# Patient Record
Sex: Female | Born: 1970 | Race: White | Hispanic: No | Marital: Single | Smoking: Former smoker
Health system: Southern US, Community
[De-identification: ages and names within clinical notes are randomized; demographics above are authoritative.]

## PROBLEM LIST (undated history)

## (undated) DIAGNOSIS — E079 Disorder of thyroid, unspecified: Secondary | ICD-10-CM

## (undated) DIAGNOSIS — N6320 Unspecified lump in the left breast, unspecified quadrant: Secondary | ICD-10-CM

## (undated) HISTORY — PX: BREAST BIOPSY: SHX20

## (undated) HISTORY — PX: TUBAL LIGATION: SHX77

## (undated) HISTORY — PX: THYROIDECTOMY, PARTIAL: SHX18

## (undated) HISTORY — PX: OTHER SURGICAL HISTORY: SHX169

## (undated) HISTORY — PX: WISDOM TOOTH EXTRACTION: SHX21

---

## 1999-06-24 ENCOUNTER — Inpatient Hospital Stay (HOSPITAL_COMMUNITY): Admission: AD | Admit: 1999-06-24 | Discharge: 1999-06-24 | Payer: Self-pay | Admitting: Obstetrics and Gynecology

## 1999-10-04 ENCOUNTER — Other Ambulatory Visit: Admission: RE | Admit: 1999-10-04 | Discharge: 1999-10-04 | Payer: Self-pay | Admitting: Obstetrics and Gynecology

## 1999-10-26 ENCOUNTER — Other Ambulatory Visit: Admission: RE | Admit: 1999-10-26 | Discharge: 1999-10-26 | Payer: Self-pay | Admitting: Obstetrics and Gynecology

## 2000-02-17 ENCOUNTER — Inpatient Hospital Stay (HOSPITAL_COMMUNITY): Admission: AD | Admit: 2000-02-17 | Discharge: 2000-02-17 | Payer: Self-pay | Admitting: *Deleted

## 2000-03-06 ENCOUNTER — Ambulatory Visit (HOSPITAL_COMMUNITY): Admission: RE | Admit: 2000-03-06 | Discharge: 2000-03-06 | Payer: Self-pay | Admitting: *Deleted

## 2000-03-06 ENCOUNTER — Encounter: Payer: Self-pay | Admitting: *Deleted

## 2000-04-19 ENCOUNTER — Inpatient Hospital Stay (HOSPITAL_COMMUNITY): Admission: AD | Admit: 2000-04-19 | Discharge: 2000-04-23 | Payer: Self-pay | Admitting: Obstetrics and Gynecology

## 2000-05-23 ENCOUNTER — Other Ambulatory Visit: Admission: RE | Admit: 2000-05-23 | Discharge: 2000-05-23 | Payer: Self-pay | Admitting: Obstetrics and Gynecology

## 2000-08-23 ENCOUNTER — Other Ambulatory Visit: Admission: RE | Admit: 2000-08-23 | Discharge: 2000-08-23 | Payer: Self-pay | Admitting: Obstetrics and Gynecology

## 2001-03-23 ENCOUNTER — Other Ambulatory Visit: Admission: RE | Admit: 2001-03-23 | Discharge: 2001-03-23 | Payer: Self-pay | Admitting: Obstetrics and Gynecology

## 2001-09-12 ENCOUNTER — Encounter: Payer: Self-pay | Admitting: Obstetrics and Gynecology

## 2001-09-12 ENCOUNTER — Inpatient Hospital Stay (HOSPITAL_COMMUNITY): Admission: AD | Admit: 2001-09-12 | Discharge: 2001-09-12 | Payer: Self-pay | Admitting: Obstetrics and Gynecology

## 2001-11-13 ENCOUNTER — Inpatient Hospital Stay (HOSPITAL_COMMUNITY): Admission: AD | Admit: 2001-11-13 | Discharge: 2001-11-13 | Payer: Self-pay | Admitting: Obstetrics and Gynecology

## 2001-11-16 ENCOUNTER — Inpatient Hospital Stay (HOSPITAL_COMMUNITY): Admission: AD | Admit: 2001-11-16 | Discharge: 2001-11-19 | Payer: Self-pay | Admitting: Obstetrics and Gynecology

## 2002-01-02 ENCOUNTER — Other Ambulatory Visit: Admission: RE | Admit: 2002-01-02 | Discharge: 2002-01-02 | Payer: Self-pay | Admitting: Obstetrics and Gynecology

## 2002-04-04 ENCOUNTER — Other Ambulatory Visit: Admission: RE | Admit: 2002-04-04 | Discharge: 2002-04-04 | Payer: Self-pay | Admitting: Obstetrics and Gynecology

## 2003-07-11 ENCOUNTER — Other Ambulatory Visit: Admission: RE | Admit: 2003-07-11 | Discharge: 2003-07-11 | Payer: Self-pay | Admitting: Obstetrics and Gynecology

## 2005-03-03 ENCOUNTER — Other Ambulatory Visit: Admission: RE | Admit: 2005-03-03 | Discharge: 2005-03-03 | Payer: Self-pay | Admitting: Obstetrics and Gynecology

## 2005-04-22 ENCOUNTER — Ambulatory Visit (HOSPITAL_COMMUNITY): Admission: RE | Admit: 2005-04-22 | Discharge: 2005-04-22 | Payer: Self-pay | Admitting: Obstetrics and Gynecology

## 2005-04-27 ENCOUNTER — Other Ambulatory Visit: Admission: RE | Admit: 2005-04-27 | Discharge: 2005-04-27 | Payer: Self-pay | Admitting: Surgery

## 2005-06-14 ENCOUNTER — Ambulatory Visit (HOSPITAL_COMMUNITY): Admission: RE | Admit: 2005-06-14 | Discharge: 2005-06-15 | Payer: Self-pay | Admitting: Surgery

## 2011-01-10 ENCOUNTER — Inpatient Hospital Stay (INDEPENDENT_AMBULATORY_CARE_PROVIDER_SITE_OTHER)
Admission: RE | Admit: 2011-01-10 | Discharge: 2011-01-10 | Disposition: A | Discharge status: Home / Self Care | Payer: 59 | Source: Ambulatory Visit | Attending: Emergency Medicine | Admitting: Emergency Medicine

## 2011-01-10 ENCOUNTER — Ambulatory Visit
Admission: RE | Admit: 2011-01-10 | Discharge: 2011-01-10 | Disposition: A | Discharge status: Home / Self Care | Payer: 59 | Source: Ambulatory Visit | Attending: Emergency Medicine | Admitting: Emergency Medicine

## 2011-01-10 ENCOUNTER — Other Ambulatory Visit: Payer: Self-pay | Admitting: Emergency Medicine

## 2011-01-10 ENCOUNTER — Encounter (INDEPENDENT_AMBULATORY_CARE_PROVIDER_SITE_OTHER): Payer: Self-pay | Admitting: *Deleted

## 2011-01-10 ENCOUNTER — Encounter: Payer: Self-pay | Admitting: Emergency Medicine

## 2011-01-10 DIAGNOSIS — R079 Chest pain, unspecified: Secondary | ICD-10-CM

## 2011-01-11 ENCOUNTER — Telehealth (INDEPENDENT_AMBULATORY_CARE_PROVIDER_SITE_OTHER): Payer: Self-pay | Admitting: *Deleted

## 2011-05-09 NOTE — Telephone Encounter (Signed)
  Phone Note Outgoing Call Call back at Home Phone [phone] [facility]     Call placed by: Lajean Saver RN,  January 11, 2011 4:06 PM Action Taken: Phone Call Completed Summary of Call: Callback: Patient reports she is doing better today.

## 2011-05-09 NOTE — Letter (Signed)
Summary: Out of Work  [facility]  [address]   [place], [place] [address]   Phone: [phone]  Fax: [phone]    January 10, 2011   Employee:  [name]    To Whom It May Concern:   For Medical reasons, please excuse the above named employee from work for the following dates:  Start:   01/10/2011  Return: 01/11/2011  If you need additional information, please feel free to contact our office.         Sincerely,    Clemens Catholic LPN

## 2011-05-09 NOTE — Progress Notes (Signed)
Summary: chest pain   Vital Signs:  Patient Profile:   40 Years Old Female CC:      chest pain x 1day Weight:      158.50 pounds O2 Sat:      99 % O2 treatment:    Room Air Temp:     98.3 degrees F oral Pulse rate:   81 / minute Resp:     20 per minute BP sitting:   119 / 81  (left arm) Cuff size:   regular  Vitals Entered By: Clemens Catholic LPN (January 10, 2011 9:42 AM)                  Updated Prior Medication List: No Medications Current Allergies: ! * BEE STINGSHistory of Present Illness Chief Complaint: chest pain x 1day History of Present Illness: CP since yesterday.  Was driving [place] from the beach and developed a sharp CP, right-central anterior chest, mild SOB, no diaphoresis, no radiation.  No HA, dizziness, blurry vision.  The pain is fairly constant although comes in waves with deep breaths and coughing.  She has had sinus drainage for the last few days as well. She reports some stress with work and while taking care of the kids at [employer].   RF: father alive, mother passed in her 14's, former smoker, no HTN, no dyslipidemia.  REVIEW OF SYSTEMS Constitutional Symptoms      Denies fever, chills, night sweats, weight loss, weight gain, and fatigue.  Eyes       Denies change in vision, eye pain, eye discharge, glasses, contact lenses, and eye surgery. Ear/Nose/Throat/Mouth       Denies hearing loss/aids, change in hearing, ear pain, ear discharge, dizziness, frequent runny nose, frequent nose bleeds, sinus problems, sore throat, hoarseness, and tooth pain or bleeding.  Respiratory       Denies dry cough, productive cough, wheezing, shortness of breath, asthma, bronchitis, and emphysema/COPD.  Cardiovascular       Complains of chest pain.      Denies murmurs and tires easily with exhertion.    Gastrointestinal       Denies stomach pain, nausea/vomiting, diarrhea, constipation, blood in bowel movements, and indigestion. Genitourniary       Denies painful  urination, kidney stones, and loss of urinary control. Neurological       Denies paralysis, seizures, and fainting/blackouts. Musculoskeletal       Denies muscle pain, joint pain, joint stiffness, decreased range of motion, redness, swelling, muscle weakness, and gout.  Skin       Denies bruising, unusual mles/lumps or sores, and hair/skin or nail changes.  Psych       Denies mood changes, temper/anger issues, anxiety/stress, speech problems, depression, and sleep problems. Other Comments: the pt states that she has had sinus problems x 1 1/2 wk. she has taken dayquil and echinacea. yesterday she developed chest pain, hurts when she breaths, and nausea. no arm, neck or jaw pain.   Past History:  Past Medical History: Unremarkable  Past Surgical History: Caesarean section Tubal ligation Thyroidectomy wisdom teeth extracted  ablasion  Family History: mom Family History of Arthritis Family History Thyroid disease auto immune liver disorder  Social History:  Smoker- quit a few mths ago Alcohol use-yes 2 glasses per wk Drug use-no Smoking Status:  current Drug Use:  no Physical Exam General appearance: well developed, well nourished, tearful but improves during exam Ears: normal, no lesions or deformities Nasal: clear discharge Oral/Pharynx: tongue normal,  posterior pharynx without erythema or exudate Neck: no carotid bruits Chest/Lungs: no rales, wheezes, or rhonchi bilateral, breath sounds equal without effort Heart: regular rate and  rhythm, no murmur Extremities: normal extremities Neurological: CN2-12 intact, normal strength and sensation distally Skin: no obvious rashes or lesions MSE: oriented to time, place, and person Assessment New Problems: CHEST PAIN, ACUTE (ICD-786.50)   Plan New Orders: New Patient Level IV [99204] EKG w/ Interpretation [93000] T-Chest x-ray, 2 views [71020] Pulse Oximetry (single measurment) [94760] Planning Comments:   EKG is  normal.  CXR is normal.  Normal VS and no red flags.  I don't feel labs are appropriate today and don't feel this is cardiac.  Her symptoms are c/w costochondritis vs chest wall strain.  Advise rest, gentle stretching (info sheet given), ATC Ibu for 2 days.  ER precautions given for further CP, SOB, etc.  Pt agrees and understands.   The patient and/or caregiver has been counseled thoroughly with regard to medications prescribed including dosage, schedule, interactions, rationale for use, and possible side effects and they verbalize understanding.  Diagnoses and expected course of recovery discussed and will return if not improved as expected or if the condition worsens. Patient and/or caregiver verbalized understanding.   Orders Added: 1)  New Patient Level IV [99204] 2)  EKG w/ Interpretation [93000] 3)  T-Chest x-ray, 2 views [71020] 4)  Pulse Oximetry (single measurment) [16109]

## 2012-06-28 ENCOUNTER — Encounter: Payer: Self-pay | Admitting: *Deleted

## 2012-06-28 ENCOUNTER — Emergency Department (INDEPENDENT_AMBULATORY_CARE_PROVIDER_SITE_OTHER)
Admission: EM | Admit: 2012-06-28 | Discharge: 2012-06-28 | Disposition: A | Discharge status: Home / Self Care | Payer: 59 | Source: Home / Self Care | Attending: Family Medicine | Admitting: Family Medicine

## 2012-06-28 DIAGNOSIS — J111 Influenza due to unidentified influenza virus with other respiratory manifestations: Secondary | ICD-10-CM

## 2012-06-28 MED ORDER — BENZONATATE 200 MG PO CAPS: 200.0000 mg | ORAL_CAPSULE | Freq: Every day | ORAL | Status: DC

## 2012-06-28 MED ORDER — OSELTAMIVIR PHOSPHATE 75 MG PO CAPS: 75.0000 mg | ORAL_CAPSULE | Freq: Two times a day (BID) | ORAL | Status: DC

## 2012-06-28 NOTE — ED Notes (Signed)
Patient c/o dry cough, low grade fever, chills, body aches x 3 days. She did not get a flu vaccine this year. Taken Day/nyquil, IBF and Mucinex otc.

## 2012-06-28 NOTE — ED Provider Notes (Signed)
History     CSN: [id number]  Arrival date & time 06/28/12  4098   First MD Initiated Contact with Patient 06/28/12 1019      Chief Complaint  Patient presents with  . Generalized Body Aches  . Cough      HPI Comments: Patient complains of dry cough, low grade fever, chills, body aches, and fatigue for 3 days. She did not get a flu vaccine this year.  The history is provided by the patient.    History reviewed. No pertinent past medical history.  Past Surgical History  Procedure Date  . Thyroidectomy, partial   . Cesarean section   . Wisdom tooth extraction     History reviewed. No pertinent family history.  History  Substance Use Topics  . Smoking status: Never Smoker   . Smokeless tobacco: Not on file  . Alcohol Use: Yes    OB History    Grav Para Term Preterm Abortions TAB SAB Ect Mult Living                  Review of Systems + sore throat + cough No pleuritic pain but has tightness in anterior chest No wheezing + nasal congestion + post-nasal drainage No sinus pain/pressure No itchy/red eyes No earache No hemoptysis No SOB No fever, + chills No nausea No vomiting No abdominal pain No diarrhea No urinary symptoms No skin rashes + fatigue + myalgias + headache Used OTC meds without relief  Allergies  Bee venom  Home Medications   Current Outpatient Rx  Name  Route  Sig  Dispense  Refill  . BENZONATATE 200 MG PO CAPS   Oral   Take 1 capsule (200 mg total) by mouth at bedtime. Take as needed for cough   12 capsule   0   . OSELTAMIVIR PHOSPHATE 75 MG PO CAPS   Oral   Take 1 capsule (75 mg total) by mouth every 12 (twelve) hours.   10 capsule   0     BP 111/74  Pulse 103  Temp 99.2 F (37.3 C) (Oral)  Resp 16  Ht 5\' 10"  (1.778 m)  Wt 176 lb (79.833 kg)  BMI 25.25 kg/m2  SpO2 100%  Physical Exam Nursing notes and Vital Signs reviewed. Appearance:  Patient appears healthy, stated age, and in no acute distress Eyes:   Pupils are equal, round, and reactive to light and accomodation.  Extraocular movement is intact.  Conjunctivae are not inflamed  Ears:  Canals normal.  Tympanic membranes normal.  Nose:  Mildly congested turbinates.  No sinus tenderness.   Pharynx:  Normal Neck:  Supple.   Tender shotty posterior nodes are palpated bilaterally  Lungs:  Clear to auscultation.  Breath sounds are equal.  Chest:   Mild tenderness to palpation over the mid-sternum.  Heart:  Regular rate and rhythm without murmurs, rubs, or gallops.  Abdomen:  Nontender without masses or hepatosplenomegaly.  Bowel sounds are present.  No CVA or flank tenderness.  Extremities:  No edema.  No calf tenderness Skin:  No rash present.   ED Course  Procedures none      1. Influenza-like illness       MDM  Begin Tamiflu.  Prescription written for Benzonatate [facility]) to take at bedtime for night-time cough.  Take Mucinex D (guaifenesin with decongestant) twice daily for congestion.  Increase fluid intake, rest. May use Afrin nasal spray (or generic oxymetazoline) twice daily for about 5 days.  Also recommend using  saline nasal spray several times daily and saline nasal irrigation (AYR is a common brand) Stop all antihistamines for now, and other non-prescription cough/cold preparations. May take Ibuprofen 200mg , 4 tabs every 8 hours with food for body aches, fever, etc. Followup with Family Doctor if not improved in one week.          Lattie Haw, MD 06/28/12 1143

## 2012-08-24 ENCOUNTER — Emergency Department (INDEPENDENT_AMBULATORY_CARE_PROVIDER_SITE_OTHER)
Admission: EM | Admit: 2012-08-24 | Discharge: 2012-08-24 | Disposition: A | Discharge status: Home / Self Care | Payer: 59 | Source: Home / Self Care | Attending: Family Medicine | Admitting: Family Medicine

## 2012-08-24 ENCOUNTER — Encounter: Payer: Self-pay | Admitting: Emergency Medicine

## 2012-08-24 DIAGNOSIS — R197 Diarrhea, unspecified: Secondary | ICD-10-CM

## 2012-08-24 DIAGNOSIS — R300 Dysuria: Secondary | ICD-10-CM

## 2012-08-24 HISTORY — DX: Disorder of thyroid, unspecified: E07.9

## 2012-08-24 LAB — POCT URINALYSIS DIP (MANUAL ENTRY)
Leukocytes, UA: NEGATIVE
Nitrite, UA: NEGATIVE
Protein Ur, POC: NEGATIVE
Urobilinogen, UA: 0.2 (ref 0–1)
pH, UA: 6 (ref 5–8)

## 2012-08-24 LAB — POCT CBC W AUTO DIFF (K'VILLE URGENT CARE)

## 2012-08-24 LAB — POCT RAPID STREP A (OFFICE): Rapid Strep A Screen: NEGATIVE

## 2012-08-24 NOTE — ED Provider Notes (Signed)
History     CSN: [id number]  Arrival date & time 08/24/12  1826   First MD Initiated Contact with Patient 08/24/12 1851      Chief Complaint  Patient presents with  . Sore Throat       HPI Comments: Patient complains of onset of nausea without vomiting today, and about mid-day developed diarrhea.  She has had a mild sore throat, chills, fatigue, and myalgias.  No nasal congestion or cough.  She complains of malodorous urine for about two months but no dysuria or frequency.  Patient is a 42 y.o. female presenting with pharyngitis and diarrhea.  Sore Throat Pertinent negatives include no abdominal pain and no headaches.  Diarrhea Quality:  Watery Severity:  Moderate Onset quality:  Sudden Duration:  6 hours Timing:  Intermittent Progression:  Improving Relieved by:  Nothing Worsened by:  Nothing tried Associated symptoms: chills and myalgias   Associated symptoms: no abdominal pain, no arthralgias, no recent cough, no diaphoresis, no fever, no headaches, no URI and no vomiting     Past Medical History  Diagnosis Date  . Thyroid disease     Past Surgical History  Procedure Laterality Date  . Thyroidectomy, partial    . Cesarean section    . Wisdom tooth extraction    . Uterine ablation    . Cesarean section with bilateral tubal ligation      Family History  Problem Relation Age of Onset  . Diabetes Father   . Heart failure Father     History  Substance Use Topics  . Smoking status: Never Smoker   . Smokeless tobacco: Not on file  . Alcohol Use: Yes    OB History   Grav Para Term Preterm Abortions TAB SAB Ect Mult Living                  Review of Systems  Constitutional: Positive for chills. Negative for fever and diaphoresis.  Gastrointestinal: Positive for diarrhea. Negative for vomiting and abdominal pain.  Musculoskeletal: Positive for myalgias. Negative for arthralgias.  Neurological: Negative for headaches.  All other systems reviewed and are  negative.    Allergies  Bee venom  Home Medications   Current Outpatient Rx  Name  Route  Sig  Dispense  Refill  . benzonatate (TESSALON) 200 MG capsule   Oral   Take 1 capsule (200 mg total) by mouth at bedtime. Take as needed for cough   12 capsule   0   . oseltamivir (TAMIFLU) 75 MG capsule   Oral   Take 1 capsule (75 mg total) by mouth every 12 (twelve) hours.   10 capsule   0     BP 137/80  Pulse 74  Temp(Src) 97.9 F (36.6 C) (Oral)  Ht 5\' 10"  (1.778 m)  Wt 177 lb (80.287 kg)  BMI 25.4 kg/m2  SpO2 100%  Physical Exam Nursing notes and Vital Signs reviewed. Appearance:  Patient appears healthy, stated age, and in no acute distress Eyes:  Pupils are equal, round, and reactive to light and accomodation.  Extraocular movement is intact.  Conjunctivae are not inflamed  Nose:   Normal Pharynx:  Minimal erythema Neck:  Supple.  Slightly tender shotty anterior/posterior nodes are palpated bilaterally  Lungs:  Clear to auscultation.  Breath sounds are equal.  Heart:  Regular rate and rhythm without murmurs, rubs, or gallops.  Abdomen:   Mild diffuse tenderness without masses or hepatosplenomegaly.  Bowel sounds are present.  No CVA or  flank tenderness.  Negative iliopsoas and obdurator tests Extremities:  No edema.   Skin:  No rash present.   ED Course  Procedures  none  Labs Reviewed  URINE CULTURE pending  COMPREHENSIVE METABOLIC PANEL pending  POCT RAPID STREP A (OFFICE) negative  POCT URINALYSIS DIP (MANUAL ENTRY) BLO trace intact, otherwise negative   POCT CBC W AUTO DIFF ([facility]) WBC 7.2; LY 51.0; MO 4.5; GR 44.5; Hgb 13.9; Platelets 217       1. Dysuria   2. Diarrhea; suspect viral syndrome       MDM  Because of patient's complaint of malodorous urine (without dysuria, frequency) and weight gain for about 2 months, will obtain urine culture and CMP. Throat culture pending  Begin clear liquids (Pedialyte while having diarrhea) until  improved, then advance to a BRAT diet.  Then gradually resume a regular diet when tolerated.  Avoid milk products until well.  To decrease diarrhea, mix one heaping tablespoon Citrucel (methylcellulose) in 8 oz water and drink one to three times daily.  When stools become more formed, may take Imodium (loperamide) once or twice daily to decrease stool frequency.  If symptoms become significantly worse during the night or over the weekend, proceed to the local emergency room.        Lattie Haw, MD 08/24/12 2004

## 2012-08-24 NOTE — ED Notes (Signed)
Reports onset of sore throat with sense of internal swelling yesterday; has other concerns over past months.

## 2012-08-25 LAB — COMPREHENSIVE METABOLIC PANEL
ALT: 18 U/L (ref 0–35)
AST: 16 U/L (ref 0–37)
Albumin: 4.7 g/dL (ref 3.5–5.2)
Alkaline Phosphatase: 69 U/L (ref 39–117)
Calcium: 9.4 mg/dL (ref 8.4–10.5)
Chloride: 103 mEq/L (ref 96–112)
Creat: 0.67 mg/dL (ref 0.50–1.10)
Potassium: 4.6 mEq/L (ref 3.5–5.3)

## 2012-08-28 LAB — URINE CULTURE: Colony Count: 45000

## 2012-08-29 ENCOUNTER — Telehealth: Payer: Self-pay | Admitting: *Deleted

## 2014-01-01 ENCOUNTER — Encounter: Payer: Self-pay | Admitting: Emergency Medicine

## 2014-01-01 ENCOUNTER — Emergency Department (INDEPENDENT_AMBULATORY_CARE_PROVIDER_SITE_OTHER)
Admission: EM | Admit: 2014-01-01 | Discharge: 2014-01-01 | Disposition: A | Discharge status: Home / Self Care | Payer: 59 | Source: Home / Self Care | Attending: Family Medicine | Admitting: Family Medicine

## 2014-01-01 DIAGNOSIS — W57XXXA Bitten or stung by nonvenomous insect and other nonvenomous arthropods, initial encounter: Principal | ICD-10-CM

## 2014-01-01 DIAGNOSIS — S30860A Insect bite (nonvenomous) of lower back and pelvis, initial encounter: Secondary | ICD-10-CM

## 2014-01-01 MED ORDER — DOXYCYCLINE HYCLATE 100 MG PO CAPS: 100.0000 mg | ORAL_CAPSULE | Freq: Two times a day (BID) | ORAL | Status: DC

## 2014-01-01 NOTE — ED Notes (Signed)
Pt c/o tick bite on her LT buttock x 1 wk ago with rash.

## 2014-01-01 NOTE — ED Provider Notes (Signed)
CSN: [id number]     Arrival date & time 01/01/14  1843 History   First MD Initiated Contact with Patient 01/01/14 1938     Chief Complaint  Patient presents with  . Insect Bite  . Rash      HPI Comments: Patient removed a tick (about 64mm diameter) from her left buttock about 1.5 weeks ago.  The tick was embedded but not engorged.  The bite site became pruritic, and the erythema has gradually increased in diameter.  She feels well and has had no systemic symptoms.  The history is provided by the patient.    Past Medical History  Diagnosis Date  . Thyroid disease    Past Surgical History  Procedure Laterality Date  . Thyroidectomy, partial    . Cesarean section    . Wisdom tooth extraction    . Uterine ablation    . Cesarean section with bilateral tubal ligation     Family History  Problem Relation Age of Onset  . Diabetes Father   . Heart failure Father    History  Substance Use Topics  . Smoking status: Never Smoker   . Smokeless tobacco: Not on file  . Alcohol Use: Yes   OB History   Grav Para Term Preterm Abortions TAB SAB Ect Mult Living                 Review of Systems  Constitutional: Negative for fever, chills, activity change and fatigue.  HENT: Negative.   Eyes: Negative.   Respiratory: Negative.   Cardiovascular: Negative.   Gastrointestinal: Negative.   Genitourinary: Negative.   Musculoskeletal: Negative for arthralgias, joint swelling and myalgias.  Skin: Positive for rash.  Neurological: Negative for headaches.    Allergies  Bee venom  Home Medications   Prior to Admission medications   Medication Sig Start Date End Date Taking? Authorizing Provider  phentermine 37.5 MG capsule Take 37.5 mg by mouth every morning.   Yes Historical Provider, MD  doxycycline (VIBRAMYCIN) 100 MG capsule Take 1 capsule (100 mg total) by mouth 2 (two) times daily. Take with food. 01/01/14   Kandra Nicolas, MD   BP 117/74  Pulse 77  Temp(Src) 98.9 F (37.2 C)  (Oral)  Resp 16  Ht 5\' 10"  (1.778 m)  Wt 172 lb (78.019 kg)  BMI 24.68 kg/m2  SpO2 100% Physical Exam  Nursing note and vitals reviewed. Constitutional: She is oriented to person, place, and time. She appears well-developed and well-nourished. No distress.  HENT:  Head: Normocephalic.  Mouth/Throat: Oropharynx is clear and moist.  Eyes: Conjunctivae are normal. Pupils are equal, round, and reactive to light.  Neck: Neck supple.  Cardiovascular: Normal heart sounds.   Pulmonary/Chest: Breath sounds normal.  Abdominal: There is no tenderness.  Lymphadenopathy:    She has no cervical adenopathy.  Neurological: She is alert and oriented to person, place, and time.  Skin: Skin is warm and dry.     Left upper buttock has a 3cm diameter nummular patch of erythema as noted on diagram.  No tenderness or swelling.      ED Course  Procedures  none    Labs Reviewed  ROCKY MTN SPOTTED FVR AB, IGM-BLOOD  ROCKY MTN SPOTTED FVR AB, IGG-BLOOD  LYME DISEASE DNA BY PCR(BORRELIA BURG)         MDM   1. Tick bite of buttock, initial encounter; suspect superficial cellulitis      Begin doxycycline for staph coverage. RMSF and Lyme  Disease titers pending. Return for worsening symptoms.    Kandra Nicolas, MD 01/05/14 [phone]

## 2014-01-01 NOTE — Discharge Instructions (Signed)
Lyme Disease You may have been bitten by a tick and are to watch for the development of Lyme Disease. Lyme Disease is an infection that is caused by a bacteria The bacteria causing this disease is named Borreilia burgdorferi. If a tick is infected with this bacteria and then bites you, then Lyme Disease may occur. These ticks are carried by deer and rodents such as rabbits and mice and infest grassy as well as forested areas. Fortunately most tick bites do not cause Lyme Disease.  Lyme Disease is easier to prevent than to treat. First, covering your legs with clothing when walking in areas where ticks are possibly abundant will prevent their attachment because ticks tend to stay within inches of the ground. Second, using insecticides containing DEET can be applied on skin or clothing. Last, because it takes about 12 to 24 hours for the tick to transmit the disease after attachment to the human host, you should inspect your body for ticks twice a day when you are in areas where Lyme Disease is common. You must look thoroughly when searching for ticks. The Ixodes tick that carries Lyme Disease is very small. It is around the size of a sesame seed (picture of tick is not actual size). Removal is best done by grasping the tick by the head and pulling it out. Do not to squeeze the body of the tick. This could inject the infecting bacteria into the bite site. Wash the area of the bite with an antiseptic solution after removal.  Lyme Disease is a disease that may affect many body systems. Because of the small size of the biting tick, most people do not notice being bitten. The first sign of an infection is usually a round red rash that extends out from the center of the tick bite. The center of the lesion may be blood colored (hemorrhagic) or have tiny blisters (vesicular). Most lesions have bright red outer borders and partial central clearing. This rash may extend out many inches in diameter, and multiple lesions may  be present. Other symptoms such as fatigue, headaches, chills and fever, general achiness and swelling of lymph glands may also occur. If this first stage of the disease is left untreated, these symptoms may gradually resolve by themselves, or progressive symptoms may occur because of spread of infection to other areas of the body.  Follow up with your caregiver to have testing and treatment if you have a tick bite and you develop any of the above complaints. Your caregiver may recommend preventative (prophylactic) medications which kill bacteria (antibiotics). Once a diagnosis of Lyme Disease is made, antibiotic treatment is highly likely to cure the disease. Effective treatment of late stage Lyme Disease may require longer courses of antibiotic therapy.  MAKE SURE YOU:   Understand these instructions.  Will watch your condition.  Will get help right away if you are not doing well or get worse. Document Released: 08/29/2000 Document Revised: 08/15/2011 Document Reviewed: 10/31/2008 Exit[employer] Patient Information 2015 [employer]. This information is not intended to replace advice given to you by your health care provider. Make sure you discuss any questions you have with your health care provider.  

## 2014-01-02 LAB — ROCKY MTN SPOTTED FVR AB, IGM-BLOOD: ROCKY MTN SPOTTED FEVER, IGM: 0.16 IV

## 2014-01-02 LAB — ROCKY MTN SPOTTED FVR AB, IGG-BLOOD: RMSF IgG: 0.09 IV

## 2014-01-03 LAB — LYME DISEASE DNA BY PCR(BORRELIA BURG): B BURGDORFERI DNA: NOT DETECTED

## 2014-01-07 ENCOUNTER — Telehealth: Payer: Self-pay

## 2014-01-07 NOTE — ED Notes (Addendum)
Left a message on voice mail asking how patient is feeling and advising to call back with any questions or concerns. Also left message that her labs where normal.

## 2014-02-06 ENCOUNTER — Ambulatory Visit
Admission: RE | Admit: 2014-02-06 | Discharge: 2014-02-06 | Disposition: A | Discharge status: Home / Self Care | Payer: 59 | Source: Ambulatory Visit | Attending: Obstetrics and Gynecology | Admitting: Obstetrics and Gynecology

## 2014-02-06 ENCOUNTER — Ambulatory Visit
Admission: RE | Admit: 2014-02-06 | Discharge: 2014-02-06 | Disposition: A | Discharge status: Home / Self Care | Payer: 59 | Source: Ambulatory Visit

## 2014-02-06 ENCOUNTER — Other Ambulatory Visit: Payer: Self-pay | Admitting: Obstetrics and Gynecology

## 2014-02-06 ENCOUNTER — Encounter (INDEPENDENT_AMBULATORY_CARE_PROVIDER_SITE_OTHER): Payer: Self-pay

## 2014-02-06 DIAGNOSIS — N63 Unspecified lump in unspecified breast: Secondary | ICD-10-CM

## 2014-02-14 ENCOUNTER — Ambulatory Visit
Admission: RE | Admit: 2014-02-14 | Discharge: 2014-02-14 | Disposition: A | Discharge status: Home / Self Care | Payer: 59 | Source: Ambulatory Visit | Attending: Obstetrics and Gynecology | Admitting: Obstetrics and Gynecology

## 2014-02-14 ENCOUNTER — Other Ambulatory Visit: Payer: Self-pay | Admitting: Obstetrics and Gynecology

## 2014-02-14 DIAGNOSIS — N63 Unspecified lump in unspecified breast: Secondary | ICD-10-CM

## 2014-03-05 ENCOUNTER — Other Ambulatory Visit: Payer: Self-pay | Admitting: Obstetrics and Gynecology

## 2014-03-06 LAB — CYTOLOGY - PAP

## 2015-06-05 ENCOUNTER — Encounter: Payer: Self-pay | Admitting: *Deleted

## 2015-06-05 ENCOUNTER — Emergency Department (INDEPENDENT_AMBULATORY_CARE_PROVIDER_SITE_OTHER)
Admission: EM | Admit: 2015-06-05 | Discharge: 2015-06-05 | Disposition: A | Discharge status: Home / Self Care | Payer: 59 | Source: Home / Self Care | Attending: Family Medicine | Admitting: Family Medicine

## 2015-06-05 DIAGNOSIS — M542 Cervicalgia: Secondary | ICD-10-CM | POA: Diagnosis not present

## 2015-06-05 DIAGNOSIS — M6248 Contracture of muscle, other site: Secondary | ICD-10-CM

## 2015-06-05 DIAGNOSIS — M62838 Other muscle spasm: Secondary | ICD-10-CM

## 2015-06-05 MED ORDER — IBUPROFEN 600 MG PO TABS
600.0000 mg | ORAL_TABLET | Freq: Once | ORAL | Status: AC
  Administered 2015-06-05: 600 mg via ORAL

## 2015-06-05 MED ORDER — DEXAMETHASONE SODIUM PHOSPHATE 10 MG/ML IJ SOLN
10.0000 mg | Freq: Once | INTRAMUSCULAR | Status: AC
  Administered 2015-06-05: 10 mg via INTRAMUSCULAR

## 2015-06-05 MED ORDER — HYDROCODONE-ACETAMINOPHEN 5-325 MG PO TABS: 1.0000 | ORAL_TABLET | Freq: Four times a day (QID) | ORAL | Status: DC | PRN

## 2015-06-05 MED ORDER — METHOCARBAMOL 500 MG PO TABS: 500.0000 mg | ORAL_TABLET | Freq: Two times a day (BID) | ORAL | Status: DC

## 2015-06-05 MED ORDER — IBUPROFEN 600 MG PO TABS: 600.0000 mg | ORAL_TABLET | Freq: Four times a day (QID) | ORAL | Status: DC | PRN

## 2015-06-05 MED ORDER — PREDNISONE 20 MG PO TABS: ORAL_TABLET | ORAL | Status: DC

## 2015-06-05 MED ORDER — METHOCARBAMOL 500 MG PO TABS
500.0000 mg | ORAL_TABLET | Freq: Two times a day (BID) | ORAL | Status: DC
Start: 2015-06-05 — End: 2018-03-05

## 2015-06-05 NOTE — ED Provider Notes (Signed)
CSN: [id number]     Arrival date & time 06/05/15  1130 History   First MD Initiated Contact with Patient 06/05/15 1203     Chief Complaint  Patient presents with  . Neck Pain   (Consider location/radiation/quality/duration/timing/severity/associated sxs/prior Treatment) HPI  Pt is a 44yo female presenting to [facility] with c/o sudden onset neck pain that is worse on the Left side of her neck.  Pain is sharp and sore, 8/10.  Pt notes the pain started after she was walking back from going to the bathroom early this morning.  Pain was not present when she first woke. She denies recent falls, heavy lifting, or other known injuries.  Denies numbness, tingling or pain in arms or legs. No prior injury or surgery to her neck or back. Denies back pain. Denies fever, headache, nasal congestion, ear pain or sore throat. She did try Aleve and acetaminophen without relief.  Pt states she otherwise feels well.  Past Medical History  Diagnosis Date  . Thyroid disease    Past Surgical History  Procedure Laterality Date  . Thyroidectomy, partial    . Cesarean section    . Wisdom tooth extraction    . Uterine ablation    . Cesarean section with bilateral tubal ligation     Family History  Problem Relation Age of Onset  . Diabetes Father   . Heart failure Father    Social History  Substance Use Topics  . Smoking status: Never Smoker   . Smokeless tobacco: None  . Alcohol Use: Yes   OB History    No data available     Review of Systems  Constitutional: Negative for fever and chills.  Musculoskeletal: Positive for myalgias, neck pain and neck stiffness. Negative for back pain and gait problem.  Skin: Negative for color change, pallor, rash and wound.  Neurological: Negative for weakness, numbness and headaches.    Allergies  Bee venom  Home Medications   Prior to Admission medications   Medication Sig Start Date End Date Taking? Authorizing Provider  HYDROcodone-acetaminophen  (NORCO/VICODIN) 5-325 MG tablet Take 1-2 tablets by mouth every 6 (six) hours as needed for moderate pain or severe pain. 06/05/15   Noland Fordyce, PA-C  ibuprofen (ADVIL,MOTRIN) 600 MG tablet Take 1 tablet (600 mg total) by mouth every 6 (six) hours as needed for moderate pain. 06/05/15   Noland Fordyce, PA-C  methocarbamol (ROBAXIN) 500 MG tablet Take 1 tablet (500 mg total) by mouth 2 (two) times daily. 06/05/15   Noland Fordyce, PA-C  predniSONE (DELTASONE) 20 MG tablet 2 tabs po daily x 3 days 06/05/15   Noland Fordyce, PA-C   Meds Ordered and Administered this Visit   Medications  ibuprofen (ADVIL,MOTRIN) tablet 600 mg (600 mg Oral Given 06/05/15 1205)  dexamethasone (DECADRON) injection 10 mg (10 mg Intramuscular Given 06/05/15 1217)    BP 124/77 mmHg  Pulse 78  Temp(Src) 98 F (36.7 C) (Oral)  Resp 16  Wt 190 lb (86.183 kg)  SpO2 100% No data found.   Physical Exam  Constitutional: She is oriented to person, place, and time. She appears well-developed and well-nourished.  Pt sitting on exam table.  Hesitant to rotate head Left or Right.  HENT:  Head: Normocephalic and atraumatic.  Right Ear: Hearing, tympanic membrane, external ear and ear canal normal.  Left Ear: Hearing, tympanic membrane, external ear and ear canal normal.  Nose: Nose normal.  Mouth/Throat: Uvula is midline, oropharynx is clear and moist and mucous membranes  are normal.  Eyes: EOM are normal.  Neck: Neck supple. Muscular tenderness present. Decreased range of motion present.  Minimal midline cervical tenderness. Worse tenderness to Left and Right cervical muscles. Limited ROM due to pain and stiffness. No crepitus. No spinal step-offs.    Cardiovascular: Normal rate, regular rhythm and normal heart sounds.   Pulmonary/Chest: Effort normal and breath sounds normal. No stridor. No respiratory distress. She has no wheezes. She has no rales. She exhibits no tenderness.  Musculoskeletal:  No midline spinal  tenderness. No tenderness to thoracic or lumbar muscles. Full ROM upper and lower extremities.  Increased neck pain with full abduction of Left and Right arm.  5/5 grip strength bilaterally.  Lymphadenopathy:    She has no cervical adenopathy.  Neurological: She is alert and oriented to person, place, and time.  Normal sensation in upper and lower extremities bilaterally. Normal gait.   Skin: Skin is warm and dry. No rash noted. No erythema.  Psychiatric: She has a normal mood and affect. Her behavior is normal.  Nursing note and vitals reviewed.   ED Course  Procedures (including critical care time)  Labs Review Labs Reviewed - No data to display  Imaging Review No results found.    MDM   1. Muscle spasms of neck   2. Neck pain    Pt c/o sudden onset neck pain without known injury. No recent illness. Pt is afebrile.  Tenderness to cervical muscles, worse on Left side. Neck stiffness present.  Pt appears well otherwise. Doubt meningitis.  Pain likely due to muscle spasm.  Tx in UC: ibuprofen 600mg  PO and Decadron 10mg  IM, heating pack provided. Pain improved from 8/10 to 7/10 while pt in UC. Expected to keep improving as medication has more time to work and after pt takes strong prescribed pain medications.  Rx: norco, robaxin, ibuprofen, and prednisone 40mg  for 3 days. Home care instructions provided including home exercises. Pt advised to only do gentle movement with her head. No heavy lifting or quick head movements.  F/u with PCP in 2-3 days if not improving. Discussed symptoms that warrant emergent care in the ED. Patient verbalized understanding and agreement with treatment plan.    Noland Fordyce, PA-C 06/05/15 [place], PA-C 06/05/15 1255

## 2015-06-05 NOTE — Discharge Instructions (Signed)
Norco/Vicodin (hydrocodone/acetaminophen) is a narcotic pain medication, do not combine these medications with others containing tylenol. While taking, do not drink alcohol, drive, or perform any other activities that requires focus while taking these medications.   Robaxin (methocarbamol) is a muscle relaxer and may cause drowsiness. Do not drink alcohol, drive, or operate heavy machinery while taking.  Today you were given a shot of Decadron 10mg , this is a steroid given to help with inflammation in you muscles to help decrease pain.  You have been prescribed 3 days of prednisone 40mg .  You should start taking this medication tomorrow morning with breakfast.

## 2015-06-05 NOTE — ED Notes (Signed)
Pt c/o neck pain that started while walking back to bed from the bathroom last night. Pain radiates into shoulders. Limited ROM. Otherwise feels well. Pain severe 8/10. Tylenol without relief.

## 2015-08-02 ENCOUNTER — Emergency Department (INDEPENDENT_AMBULATORY_CARE_PROVIDER_SITE_OTHER)
Admission: EM | Admit: 2015-08-02 | Discharge: 2015-08-02 | Disposition: A | Discharge status: Home / Self Care | Payer: BLUE CROSS/BLUE SHIELD | Source: Home / Self Care | Attending: Family Medicine | Admitting: Family Medicine

## 2015-08-02 DIAGNOSIS — K029 Dental caries, unspecified: Secondary | ICD-10-CM

## 2015-08-02 DIAGNOSIS — K047 Periapical abscess without sinus: Secondary | ICD-10-CM

## 2015-08-02 MED ORDER — AMOXICILLIN 500 MG PO CAPS
500.0000 mg | ORAL_CAPSULE | Freq: Three times a day (TID) | ORAL | Status: DC
Start: 2015-08-02 — End: 2018-03-05

## 2015-08-02 NOTE — ED Provider Notes (Signed)
CSN: [id number]     Arrival date & time 08/02/15  1450 History   First MD Initiated Contact with Patient 08/02/15 1536     Chief Complaint  Patient presents with  . Dental Pain   (Consider location/radiation/quality/duration/timing/severity/associated sxs/prior Treatment) HPI The pt is a 45yo female presenting to [facility] with c/o Left lower dental pain that started suddenly Friday night, 2 days ago. Pain is aching and throbbing, 10/10, worse when eating or drinking anything. She notes her tooth has been cracked for several months but has not caused pain until recently. Her dentist does know about her tooth but does not know about current pain as it just started.  Pt reports mild swelling to the Left side of her face. She took leftover hydrocodone from a neck injury a few months ago as well as ibuprofen but only minimal relief of pain. Denies n/v/d. Denies difficulty breathing or swallowing. No bleeding or discharge from her gums.   Past Medical History  Diagnosis Date  . Thyroid disease    Past Surgical History  Procedure Laterality Date  . Thyroidectomy, partial    . Cesarean section    . Wisdom tooth extraction    . Uterine ablation    . Cesarean section with bilateral tubal ligation     Family History  Problem Relation Age of Onset  . Diabetes Father   . Heart failure Father    Social History  Substance Use Topics  . Smoking status: Never Smoker   . Smokeless tobacco: None  . Alcohol Use: Yes   OB History    No data available     Review of Systems  Constitutional: Negative for fever and chills.  HENT: Positive for dental problem and facial swelling (Left lower). Negative for sore throat, trouble swallowing and voice change.   Respiratory: Negative for cough and shortness of breath.   Gastrointestinal: Negative for nausea, vomiting and diarrhea.  Musculoskeletal: Negative for neck pain and neck stiffness.    Allergies  Bee venom  Home Medications   Prior to Admission  medications   Medication Sig Start Date End Date Taking? Authorizing Provider  amoxicillin (AMOXIL) 500 MG capsule Take 1 capsule (500 mg total) by mouth 3 (three) times daily. 08/02/15   Noland Fordyce, PA-C  HYDROcodone-acetaminophen (NORCO/VICODIN) 5-325 MG tablet Take 1-2 tablets by mouth every 6 (six) hours as needed for moderate pain or severe pain. 06/05/15   Noland Fordyce, PA-C  ibuprofen (ADVIL,MOTRIN) 600 MG tablet Take 1 tablet (600 mg total) by mouth every 6 (six) hours as needed for moderate pain. 06/05/15   Noland Fordyce, PA-C  methocarbamol (ROBAXIN) 500 MG tablet Take 1 tablet (500 mg total) by mouth 2 (two) times daily. 06/05/15   Noland Fordyce, PA-C  predniSONE (DELTASONE) 20 MG tablet 2 tabs po daily x 3 days 06/05/15   Noland Fordyce, PA-C   Meds Ordered and Administered this Visit  Medications - No data to display  BP 164/97 mmHg  Pulse 120  Temp(Src) 97.9 F (36.6 C) (Tympanic)  Ht 5\' 10"  (1.778 m)  Wt 190 lb (86.183 kg)  BMI 27.26 kg/m2  SpO2 100% No data found.   Physical Exam  Constitutional: She is oriented to person, place, and time. She appears well-developed and well-nourished. She appears distressed.  tearful  HENT:  Head: Normocephalic and atraumatic.  Right Ear: Tympanic membrane normal.  Left Ear: Tympanic membrane normal.  Nose: Nose normal.  Mouth/Throat: Uvula is midline, oropharynx is clear and moist and  mucous membranes are normal. No trismus in the jaw. Abnormal dentition. Dental abscesses and dental caries present.    Left lower last molars with fills, 2nd to last molar is cracked with over half of tooth missing. Mild gingival edema with erythema. No bleeding or discharge.  Mild swelling to Left lower cheek.   Eyes: EOM are normal.  Neck: Normal range of motion. Neck supple.  Cardiovascular: Normal rate.   Pulmonary/Chest: Effort normal. No stridor.  Musculoskeletal: Normal range of motion.  Lymphadenopathy:    She has no cervical  adenopathy.  Neurological: She is alert and oriented to person, place, and time.  Skin: Skin is warm and dry.  Psychiatric: She has a normal mood and affect. Her behavior is normal.  Nursing note and vitals reviewed.   ED Course  Procedures (including critical care time)  Labs Review Labs Reviewed - No data to display  Imaging Review No results found.   MDM   1. Pain due to dental caries   2. Dental decay   3. Dental abscess    Pt presenting to [facility] with c/o sudden onset dental pain from 2 days ago, gradually worsening.   Dental exam c/w dental decay and gingival abscess but no indication for I&D at this time as there is no significant gingival edema or fluctuance.  Will start pt on Amoxicillin. May continue to take acetaminophen and ibuprofen. Encouraged warm saltwater gargles and OTC Oragel.  Pt states she does not want any more pain medication as she had a family member who overdosed on heroin.   Pt does have a dentist and plans to call him first thing in the morning.   Noland Fordyce, PA-C 08/02/15 1620

## 2015-08-02 NOTE — Discharge Instructions (Signed)
You may take 400-600mg  Ibuprofen (Motrin) every 6-8 hours for fever and pain  Alternate with Tylenol  You may take 500mg  Tylenol every 4-6 hours as needed for fever and pain   You may also use warm salt water gargles as well as over the counter Oragel to help with pain and swelling  Follow-up with your primary care provider next week for recheck of symptoms if not improving.  Be sure to drink plenty of fluids and rest, at least 8hrs of sleep a night, preferably more while you are sick. Return urgent care or go to closest ER if you cannot keep down fluids/signs of dehydration, fever not reducing with Tylenol, difficulty breathing/wheezing, stiff neck, worsening condition, or other concerns (see below)  Please take antibiotics as prescribed and be sure to complete entire course even if you start to feel better to ensure infection does not come back.   Dental Abscess A dental abscess is pus in or around a tooth. HOME CARE  Take medicines only as told by your dentist.  If you were prescribed antibiotic medicine, finish all of it even if you start to feel better.  Rinse your mouth (gargle) often with salt water.  Do not drive or use heavy machinery, like a lawn mower, while taking pain medicine.  Do not apply heat to the outside of your mouth.  Keep all follow-up visits as told by your dentist. This is important. GET HELP IF:  Your pain is worse, and medicine does not help. GET HELP RIGHT AWAY IF:  You have a fever or chills.  Your symptoms suddenly get worse.  You have a very bad headache.  You have problems breathing or swallowing.  You have trouble opening your mouth.  You have puffiness (swelling) in your neck or around your eye.   This information is not intended to replace advice given to you by your health care provider. Make sure you discuss any questions you have with your health care provider.   Document Released: 10/07/2014 Document Reviewed: 10/07/2014 Elsevier  Interactive Patient Education 2016 [place] and Dentist Visits Dental care supports good overall health. Regular dental visits can also help you avoid dental pain, bleeding, infection, and other more serious health problems in the future. It is important to keep the mouth healthy because diseases in the teeth, gums, and other oral tissues can spread to other areas of the body. Some problems, such as diabetes, heart disease, and pre-term labor have been associated with poor oral health.  See your dentist every 6 months. If you experience emergency problems such as a toothache or broken tooth, go to the dentist right away. If you see your dentist regularly, you may catch problems early. It is easier to be treated for problems in the early stages.  WHAT TO EXPECT AT A DENTIST VISIT  Your dentist will look for many common oral health problems and recommend proper treatment. At your regular dental visit, you can expect:  Gentle cleaning of the teeth and gums. This includes scraping and polishing. This helps to remove the sticky substance around the teeth and gums (plaque). Plaque forms in the mouth shortly after eating. Over time, plaque hardens on the teeth as tartar. If tartar is not removed regularly, it can cause problems. Cleaning also helps remove stains.  Periodic X-rays. These pictures of the teeth and supporting bone will help your dentist assess the health of your teeth.  Periodic fluoride treatments. Fluoride is a natural mineral shown  to help strengthen teeth. Fluoride treatmentinvolves applying a fluoride gel or varnish to the teeth. It is most commonly done in children.  Examination of the mouth, tongue, jaws, teeth, and gums to look for any oral health problems, such as:  Cavities (dental caries). This is decay on the tooth caused by plaque, sugar, and acid in the mouth. It is best to catch a cavity when it is small.  Inflammation of the gums caused by plaque buildup  (gingivitis).  Problems with the mouth or malformed or misaligned teeth.  Oral cancer or other diseases of the soft tissues or jaws. KEEP YOUR TEETH AND GUMS HEALTHY For healthy teeth and gums, follow these general guidelines as well as your dentist's specific advice:  Have your teeth professionally cleaned at the dentist every 6 months.  Brush twice daily with a fluoride toothpaste.  Floss your teeth daily.  Ask your dentist if you need fluoride supplements, treatments, or fluoride toothpaste.  Eat a healthy diet. Reduce foods and drinks with added sugar.  Avoid smoking. TREATMENT FOR ORAL HEALTH PROBLEMS If you have oral health problems, treatment varies depending on the conditions present in your teeth and gums.  Your caregiver will most likely recommend good oral hygiene at each visit.  For cavities, gingivitis, or other oral health disease, your caregiver will perform a procedure to treat the problem. This is typically done at a separate appointment. Sometimes your caregiver will refer you to another dental specialist for specific tooth problems or for surgery. SEEK IMMEDIATE DENTAL CARE IF:  You have pain, bleeding, or soreness in the gum, tooth, jaw, or mouth area.  A permanent tooth becomes loose or separated from the gum socket.  You experience a blow or injury to the mouth or jaw area.   This information is not intended to replace advice given to you by your health care provider. Make sure you discuss any questions you have with your health care provider.   Document Released: 02/02/2011 Document Revised: 08/15/2011 Document Reviewed: 02/02/2011 Elsevier Interactive Patient Education [employer].

## 2015-08-02 NOTE — ED Notes (Signed)
Has a broken tooth on left side of mouth.  Started with pain Friday night, and has become worse.

## 2017-12-27 ENCOUNTER — Other Ambulatory Visit: Payer: Self-pay | Admitting: Obstetrics and Gynecology

## 2017-12-27 DIAGNOSIS — N6320 Unspecified lump in the left breast, unspecified quadrant: Secondary | ICD-10-CM

## 2018-01-02 ENCOUNTER — Ambulatory Visit
Admission: RE | Admit: 2018-01-02 | Discharge: 2018-01-02 | Disposition: A | Discharge status: Home / Self Care | Payer: 59 | Source: Ambulatory Visit | Attending: Obstetrics and Gynecology | Admitting: Obstetrics and Gynecology

## 2018-01-02 ENCOUNTER — Ambulatory Visit
Admission: RE | Admit: 2018-01-02 | Discharge: 2018-01-02 | Disposition: A | Discharge status: Home / Self Care | Payer: BLUE CROSS/BLUE SHIELD | Source: Ambulatory Visit | Attending: Obstetrics and Gynecology | Admitting: Obstetrics and Gynecology

## 2018-01-02 DIAGNOSIS — N6320 Unspecified lump in the left breast, unspecified quadrant: Secondary | ICD-10-CM

## 2018-01-04 HISTORY — PX: BREAST EXCISIONAL BIOPSY: SUR124

## 2018-01-18 ENCOUNTER — Other Ambulatory Visit: Payer: Self-pay | Admitting: Surgery

## 2018-03-05 ENCOUNTER — Encounter (HOSPITAL_BASED_OUTPATIENT_CLINIC_OR_DEPARTMENT_OTHER): Payer: Self-pay | Admitting: *Deleted

## 2018-03-05 ENCOUNTER — Other Ambulatory Visit: Payer: Self-pay

## 2018-03-07 NOTE — Progress Notes (Signed)
Patient given ensure presurgery and CHG surgical scrub with instructions on use, pt voiced understanding without questions.

## 2018-03-08 NOTE — Anesthesia Preprocedure Evaluation (Signed)
Anesthesia Evaluation  Patient identified by MRN, date of birth, ID band Patient awake    Reviewed: Allergy & Precautions, H&P , NPO status , Patient's Chart, lab work & pertinent test results, reviewed documented beta blocker date and time   Airway Mallampati: II  TM Distance: >3 FB Neck ROM: full    Dental no notable dental hx.    Pulmonary neg pulmonary ROS,    Pulmonary exam normal breath sounds clear to auscultation       Cardiovascular Exercise Tolerance: Good negative cardio ROS   Rhythm:regular Rate:Normal     Neuro/Psych negative neurological ROS  negative psych ROS   GI/Hepatic negative GI ROS, Neg liver ROS,   Endo/Other  negative endocrine ROS  Renal/GU negative Renal ROS  negative genitourinary   Musculoskeletal   Abdominal   Peds  Hematology negative hematology ROS (+)   Anesthesia Other Findings   Reproductive/Obstetrics negative OB ROS                             Anesthesia Physical Anesthesia Plan  ASA: II  Anesthesia Plan: General   Post-op Pain Management:    Induction: Intravenous  PONV Risk Score and Plan: 3 and Ondansetron, Dexamethasone, Treatment may vary due to age or medical condition and Midazolam  Airway Management Planned: Oral ETT and LMA  Additional Equipment:   Intra-op Plan:   Post-operative Plan: Extubation in OR  Informed Consent: I have reviewed the patients History and Physical, chart, labs and discussed the procedure including the risks, benefits and alternatives for the proposed anesthesia with the patient or authorized representative who has indicated his/her understanding and acceptance.   Dental Advisory Given  Plan Discussed with: CRNA, Anesthesiologist and Surgeon  Anesthesia Plan Comments: (  )        Anesthesia Quick Evaluation

## 2018-03-08 NOTE — H&P (Signed)
Ashley Gallagher  Location: [facility] Patient #: [id number] DOB: December 03, 1970 Undefined / Language: Cleophus Molt / Race: White Female   History of Present Illness  The patient is a 47 year old female who presents with a breast mass. This patient is referred by Dr. Radene Journey for evaluation of an enlarging left breast mass. The mass was actually identified back in 2015. It measured 2 cm ultrasound that time and a stereotactic biopsy showed a fibroadenoma. The mass has become more palpable physical examination and only follow-up mammogram and ultrasound it is now 2.6 cm in size. It does cause some discomfort. She denies nipple discharge. There is no known family history of breast cancer. She is otherwise healthy without complaints.   Past Surgical History  Breast Biopsy  Left. Oral Surgery  Thyroid Surgery   Diagnostic Studies History  Colonoscopy  never Mammogram  within last year Pap Smear  1-5 years ago  Allergies MORPHINE  Itching. Allergies Reconciled   Medication History  No Current Medications Medications Reconciled  Social History  Alcohol use  Occasional alcohol use. Caffeine use  Carbonated beverages, Coffee, Tea. No drug use  Tobacco use  Former smoker.  Family History  Alcohol Abuse  Brother, Mother. Anesthetic complications  Sister. Arthritis  Mother. Bleeding disorder  Mother. Heart Disease  Father. Thyroid problems  Mother, Sister.  Pregnancy / Birth History  Age at menarche  89 years. Gravida  3 Length (months) of breastfeeding  12-24 Maternal age  13-25 Para  3  Other Problems  Lump In Breast  Thyroid Disease     Review of Systems   General Present- Weight Gain. Not Present- Appetite Loss, Chills, Fatigue, Fever, Night Sweats and Weight Loss. Skin Not Present- Change in Wart/Mole, Dryness, Hives, Jaundice, New Lesions, Non-Healing Wounds, Rash and Ulcer. HEENT Present- Seasonal Allergies and Wears  glasses/contact lenses. Not Present- Earache, Hearing Loss, Hoarseness, Nose Bleed, Oral Ulcers, Ringing in the Ears, Sinus Pain, Sore Throat, Visual Disturbances and Yellow Eyes. Respiratory Not Present- Bloody sputum, Chronic Cough, Difficulty Breathing, Snoring and Wheezing. Breast Present- Breast Mass. Not Present- Breast Pain, Nipple Discharge and Skin Changes. Cardiovascular Not Present- Chest Pain, Difficulty Breathing Lying Down, Leg Cramps, Palpitations, Rapid Heart Rate, Shortness of Breath and Swelling of Extremities. Gastrointestinal Not Present- Abdominal Pain, Bloating, Bloody Stool, Change in Bowel Habits, Chronic diarrhea, Constipation, Difficulty Swallowing, Excessive gas, Gets full quickly at meals, Hemorrhoids, Indigestion, Nausea, Rectal Pain and Vomiting. Female Genitourinary Not Present- Frequency, Nocturia, Painful Urination, Pelvic Pain and Urgency. Musculoskeletal Not Present- Back Pain, Joint Pain, Joint Stiffness, Muscle Pain, Muscle Weakness and Swelling of Extremities. Neurological Not Present- Decreased Memory, Fainting, Headaches, Numbness, Seizures, Tingling, Tremor, Trouble walking and Weakness. Psychiatric Not Present- Anxiety, Bipolar, Change in Sleep Pattern, Depression, Fearful and Frequent crying. Endocrine Not Present- Cold Intolerance, Excessive Hunger, Hair Changes, Heat Intolerance, Hot flashes and New Diabetes. Hematology Not Present- Blood Thinners, Easy Bruising, Excessive bleeding, Gland problems, HIV and Persistent Infections.  Vitals  Weight: 193 lb Height: 70in Body Surface Area: 2.06 m Body Mass Index: 27.69 kg/m  Temp.: 98.32F  Pulse: 82 (Regular)  BP: 122/82 (Sitting, Left Arm, Standard)     Physical Exam  General Mental Status-Alert. General Appearance-Consistent with stated age. Hydration-Well hydrated. Voice-Normal.  Head and Neck Head-normocephalic, atraumatic with no lesions or palpable  masses. Trachea-midline. Thyroid Gland Characteristics - normal size and consistency.  Eye Eyeball - Bilateral-Extraocular movements intact. Sclera/Conjunctiva - Bilateral-No scleral icterus.  Chest and Lung Exam Chest  and lung exam reveals -quiet, even and easy respiratory effort with no use of accessory muscles and on auscultation, normal breath sounds, no adventitious sounds and normal vocal resonance. Inspection Chest Wall - Normal. Back - normal.  Breast Breast - Left-Symmetric, Non Tender, No Biopsy scars, no Dimpling, No Inflammation, No Lumpectomy scars, No Mastectomy scars, No Peau d' Orange. Breast - Right-Symmetric, Non Tender, No Biopsy scars, no Dimpling, No Inflammation, No Lumpectomy scars, No Mastectomy scars, No Peau d' Orange. Note: On examination, at the 12 o'clock position, approximately 2 cm from the areola is a 2-3 cm breast mass in the left breast. It is mobile. There are no skin changes   Cardiovascular Cardiovascular examination reveals -normal heart sounds, regular rate and rhythm with no murmurs and normal pedal pulses bilaterally.  Abdomen Inspection Inspection of the abdomen reveals - No Hernias. Skin - Scar - no surgical scars. Palpation/Percussion Palpation and Percussion of the abdomen reveal - Soft, Non Tender, No Rebound tenderness, No Rigidity (guarding) and No hepatosplenomegaly. Auscultation Auscultation of the abdomen reveals - Bowel sounds normal.  Neurologic - Did not examine.  Musculoskeletal - Did not examine.  Lymphatic Head & Neck  General Head & Neck Lymphatics: Bilateral - Description - Normal. Axillary  General Axillary Region: Bilateral - Description - Normal. Tenderness - Non Tender. Femoral & Inguinal - Did not examine.    Assessment & Plan   LEFT BREAST MASS (N63.20)  Impression: I discussed this with the patient in detail. 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 We discussed doing a left breast lumpectomy. We discussed the surgical procedure itself. I discussed the risks which includes but is not limited to bleeding, infection, injury to surrounding structures, need for further surgery if malignancy found, cardiopulmonary issues, DVT, etc. She understands and wish to proceed with surgery

## 2018-03-09 ENCOUNTER — Ambulatory Visit (HOSPITAL_BASED_OUTPATIENT_CLINIC_OR_DEPARTMENT_OTHER): Payer: 59 | Admitting: Anesthesiology

## 2018-03-09 ENCOUNTER — Encounter (HOSPITAL_BASED_OUTPATIENT_CLINIC_OR_DEPARTMENT_OTHER): Payer: Self-pay | Admitting: Certified Registered"

## 2018-03-09 ENCOUNTER — Other Ambulatory Visit: Payer: Self-pay

## 2018-03-09 ENCOUNTER — Encounter (HOSPITAL_BASED_OUTPATIENT_CLINIC_OR_DEPARTMENT_OTHER)
Admission: RE | Disposition: A | Discharge status: Home / Self Care | Payer: Self-pay | Source: Ambulatory Visit | Attending: Surgery

## 2018-03-09 ENCOUNTER — Ambulatory Visit (HOSPITAL_COMMUNITY)
Admission: RE | Admit: 2018-03-09 | Discharge: 2018-03-09 | Disposition: A | Discharge status: Home / Self Care | Payer: 59 | Source: Ambulatory Visit | Attending: Surgery | Admitting: Surgery

## 2018-03-09 DIAGNOSIS — D242 Benign neoplasm of left breast: Secondary | ICD-10-CM | POA: Insufficient documentation

## 2018-03-09 DIAGNOSIS — Z87891 Personal history of nicotine dependence: Secondary | ICD-10-CM | POA: Insufficient documentation

## 2018-03-09 DIAGNOSIS — Z885 Allergy status to narcotic agent status: Secondary | ICD-10-CM | POA: Insufficient documentation

## 2018-03-09 HISTORY — DX: Unspecified lump in the left breast, unspecified quadrant: N63.20

## 2018-03-09 HISTORY — PX: BREAST LUMPECTOMY: SHX2

## 2018-03-09 SURGERY — BREAST LUMPECTOMY
Anesthesia: General | Laterality: Left

## 2018-03-09 MED ORDER — ACETAMINOPHEN 325 MG PO TABS: 325.0000 mg | ORAL_TABLET | ORAL | Status: DC | PRN

## 2018-03-09 MED ORDER — CEFAZOLIN SODIUM-DEXTROSE 2-4 GM/100ML-% IV SOLN
INTRAVENOUS | Status: AC
  Filled 2018-03-09: qty 100

## 2018-03-09 MED ORDER — GABAPENTIN 300 MG PO CAPS
ORAL_CAPSULE | ORAL | Status: AC
  Filled 2018-03-09: qty 1

## 2018-03-09 MED ORDER — MIDAZOLAM HCL 2 MG/2ML IJ SOLN
INTRAMUSCULAR | Status: AC
  Filled 2018-03-09: qty 2

## 2018-03-09 MED ORDER — CHLORHEXIDINE GLUCONATE CLOTH 2 % EX PADS: 6.0000 | MEDICATED_PAD | Freq: Once | CUTANEOUS | Status: DC

## 2018-03-09 MED ORDER — ONDANSETRON HCL 4 MG/2ML IJ SOLN
INTRAMUSCULAR | Status: DC | PRN
  Administered 2018-03-09: 4 mg via INTRAVENOUS

## 2018-03-09 MED ORDER — LIDOCAINE HCL (CARDIAC) PF 100 MG/5ML IV SOSY
PREFILLED_SYRINGE | INTRAVENOUS | Status: DC | PRN
  Administered 2018-03-09: 60 mg via INTRAVENOUS

## 2018-03-09 MED ORDER — ACETAMINOPHEN 500 MG PO TABS
ORAL_TABLET | ORAL | Status: AC
  Filled 2018-03-09: qty 2

## 2018-03-09 MED ORDER — BUPIVACAINE-EPINEPHRINE 0.5% -1:200000 IJ SOLN
INTRAMUSCULAR | Status: DC | PRN
  Administered 2018-03-09: 10 mL

## 2018-03-09 MED ORDER — FENTANYL CITRATE (PF) 100 MCG/2ML IJ SOLN
50.0000 ug | INTRAMUSCULAR | Status: DC | PRN
  Administered 2018-03-09: 100 ug via INTRAVENOUS

## 2018-03-09 MED ORDER — CEFAZOLIN SODIUM-DEXTROSE 2-4 GM/100ML-% IV SOLN
2.0000 g | INTRAVENOUS | Status: AC
  Administered 2018-03-09: 2 g via INTRAVENOUS

## 2018-03-09 MED ORDER — FENTANYL CITRATE (PF) 100 MCG/2ML IJ SOLN: 25.0000 ug | INTRAMUSCULAR | Status: DC | PRN

## 2018-03-09 MED ORDER — OXYCODONE HCL 5 MG PO TABS: 5.0000 mg | ORAL_TABLET | Freq: Once | ORAL | Status: DC | PRN

## 2018-03-09 MED ORDER — CELECOXIB 200 MG PO CAPS
200.0000 mg | ORAL_CAPSULE | ORAL | Status: AC
  Administered 2018-03-09: 200 mg via ORAL

## 2018-03-09 MED ORDER — ACETAMINOPHEN 500 MG PO TABS
1000.0000 mg | ORAL_TABLET | ORAL | Status: AC
  Administered 2018-03-09: 1000 mg via ORAL

## 2018-03-09 MED ORDER — SCOPOLAMINE 1 MG/3DAYS TD PT72: 1.0000 | MEDICATED_PATCH | Freq: Once | TRANSDERMAL | Status: DC | PRN

## 2018-03-09 MED ORDER — MEPERIDINE HCL 25 MG/ML IJ SOLN: 6.2500 mg | INTRAMUSCULAR | Status: DC | PRN

## 2018-03-09 MED ORDER — LACTATED RINGERS IV SOLN
INTRAVENOUS | Status: DC
  Administered 2018-03-09: 07:00:00 via INTRAVENOUS

## 2018-03-09 MED ORDER — FENTANYL CITRATE (PF) 100 MCG/2ML IJ SOLN
INTRAMUSCULAR | Status: AC
  Filled 2018-03-09: qty 2

## 2018-03-09 MED ORDER — OXYCODONE HCL 5 MG/5ML PO SOLN: 5.0000 mg | Freq: Once | ORAL | Status: DC | PRN

## 2018-03-09 MED ORDER — PROPOFOL 10 MG/ML IV BOLUS
INTRAVENOUS | Status: DC | PRN
  Administered 2018-03-09: 200 mg via INTRAVENOUS

## 2018-03-09 MED ORDER — PROPOFOL 500 MG/50ML IV EMUL
INTRAVENOUS | Status: DC | PRN
  Administered 2018-03-09: 35 ug/kg/min via INTRAVENOUS

## 2018-03-09 MED ORDER — ACETAMINOPHEN 160 MG/5ML PO SOLN: 325.0000 mg | ORAL | Status: DC | PRN

## 2018-03-09 MED ORDER — CELECOXIB 200 MG PO CAPS
ORAL_CAPSULE | ORAL | Status: AC
  Filled 2018-03-09: qty 1

## 2018-03-09 MED ORDER — MIDAZOLAM HCL 2 MG/2ML IJ SOLN
1.0000 mg | INTRAMUSCULAR | Status: DC | PRN
  Administered 2018-03-09: 2 mg via INTRAVENOUS

## 2018-03-09 MED ORDER — GABAPENTIN 300 MG PO CAPS
300.0000 mg | ORAL_CAPSULE | ORAL | Status: AC
  Administered 2018-03-09: 300 mg via ORAL

## 2018-03-09 MED ORDER — DEXAMETHASONE SODIUM PHOSPHATE 4 MG/ML IJ SOLN
INTRAMUSCULAR | Status: DC | PRN
  Administered 2018-03-09: 10 mg via INTRAVENOUS

## 2018-03-09 MED ORDER — ONDANSETRON HCL 4 MG/2ML IJ SOLN: 4.0000 mg | Freq: Once | INTRAMUSCULAR | Status: DC | PRN

## 2018-03-09 MED ORDER — TRAMADOL HCL 50 MG PO TABS: 50.0000 mg | ORAL_TABLET | Freq: Four times a day (QID) | ORAL | 0 refills | Status: DC | PRN

## 2018-03-09 SURGICAL SUPPLY — 40 items
ADH SKN CLS APL DERMABOND .7 (GAUZE/BANDAGES/DRESSINGS) ×1
BLADE HEX COATED 2.75 (ELECTRODE) ×2 IMPLANT
BLADE SURG 15 STRL LF DISP TIS (BLADE) ×1 IMPLANT
BLADE SURG 15 STRL SS (BLADE) ×2
CANISTER SUCT 1200ML W/VALVE (MISCELLANEOUS) IMPLANT
CHLORAPREP W/TINT 26ML (MISCELLANEOUS) ×2 IMPLANT
CLIP VESOCCLUDE SM WIDE 6/CT (CLIP) IMPLANT
COVER BACK TABLE 60X90IN (DRAPES) ×2 IMPLANT
COVER MAYO STAND STRL (DRAPES) ×2 IMPLANT
DECANTER SPIKE VIAL GLASS SM (MISCELLANEOUS) IMPLANT
DERMABOND ADVANCED (GAUZE/BANDAGES/DRESSINGS) ×1
DERMABOND ADVANCED .7 DNX12 (GAUZE/BANDAGES/DRESSINGS) ×1 IMPLANT
DEVICE DUBIN W/COMP PLATE 8390 (MISCELLANEOUS) IMPLANT
DRAPE LAPAROTOMY 100X72 PEDS (DRAPES) ×2 IMPLANT
DRAPE UTILITY XL STRL (DRAPES) ×2 IMPLANT
ELECT REM PT RETURN 9FT ADLT (ELECTROSURGICAL) ×2
ELECTRODE REM PT RTRN 9FT ADLT (ELECTROSURGICAL) ×1 IMPLANT
GAUZE SPONGE 4X4 12PLY STRL LF (GAUZE/BANDAGES/DRESSINGS) ×1 IMPLANT
GLOVE SURG SIGNA 7.5 PF LTX (GLOVE) ×2 IMPLANT
GOWN STRL REUS W/ TWL LRG LVL3 (GOWN DISPOSABLE) ×1 IMPLANT
GOWN STRL REUS W/ TWL XL LVL3 (GOWN DISPOSABLE) ×1 IMPLANT
GOWN STRL REUS W/TWL LRG LVL3 (GOWN DISPOSABLE) ×2
GOWN STRL REUS W/TWL XL LVL3 (GOWN DISPOSABLE) ×2
KIT MARKER MARGIN INK (KITS) ×1 IMPLANT
NDL HYPO 25X1 1.5 SAFETY (NEEDLE) ×1 IMPLANT
NEEDLE HYPO 25X1 1.5 SAFETY (NEEDLE) ×2 IMPLANT
NS IRRIG 1000ML POUR BTL (IV SOLUTION) ×1 IMPLANT
PACK BASIN DAY SURGERY FS (CUSTOM PROCEDURE TRAY) ×2 IMPLANT
PENCIL BUTTON HOLSTER BLD 10FT (ELECTRODE) ×2 IMPLANT
SLEEVE SCD COMPRESS KNEE MED (MISCELLANEOUS) ×1 IMPLANT
SPONGE LAP 4X18 RFD (DISPOSABLE) ×2 IMPLANT
SUT MNCRL AB 4-0 PS2 18 (SUTURE) ×2 IMPLANT
SUT SILK 2 0 SH (SUTURE) ×1 IMPLANT
SUT VIC AB 3-0 SH 27 (SUTURE) ×2
SUT VIC AB 3-0 SH 27X BRD (SUTURE) ×1 IMPLANT
SYR CONTROL 10ML LL (SYRINGE) ×2 IMPLANT
TOWEL GREEN STERILE FF (TOWEL DISPOSABLE) ×2 IMPLANT
TOWEL OR NON WOVEN STRL DISP B (DISPOSABLE) ×2 IMPLANT
TUBE CONNECTING 20X1/4 (TUBING) IMPLANT
YANKAUER SUCT BULB TIP NO VENT (SUCTIONS) IMPLANT

## 2018-03-09 NOTE — Discharge Instructions (Signed)
[place] Office Phone Number [phone]  BREAST BIOPSY/ PARTIAL MASTECTOMY: POST OP INSTRUCTIONS  Always review your discharge instruction sheet given to you by the facility where your surgery was performed.  IF YOU HAVE DISABILITY OR FAMILY LEAVE FORMS, YOU MUST BRING THEM TO THE OFFICE FOR PROCESSING.  DO NOT GIVE THEM TO YOUR DOCTOR.  1. A prescription for pain medication may be given to you upon discharge.  Take your pain medication as prescribed, if needed.  If narcotic pain medicine is not needed, then you may take acetaminophen (Tylenol) or ibuprofen (Advil) as needed. 2. Take your usually prescribed medications unless otherwise directed 3. If you need a refill on your pain medication, please contact your pharmacy.  They will contact our office to request authorization.  Prescriptions will not be filled after 5pm or on week-ends. 4. You should eat very light the first 24 hours after surgery, such as soup, crackers, pudding, etc.  Resume your normal diet the day after surgery. 5. Most patients will experience some swelling and bruising in the breast.  Ice packs and a good support bra will help.  Swelling and bruising can take several days to resolve.  6. It is common to experience some constipation if taking pain medication after surgery.  Increasing fluid intake and taking a stool softener will usually help or prevent this problem from occurring.  A mild laxative (Milk of Magnesia or Miralax) should be taken according to package directions if there are no bowel movements after 48 hours. 7. Unless discharge instructions indicate otherwise, you may remove your bandages 24-48 hours after surgery, and you may shower at that time.  You may have steri-strips (small skin tapes) in place directly over the incision.  These strips should be left on the skin for 7-10 days.  If your surgeon used skin glue on the incision, you may shower in 24 hours.  The glue will flake off over the  next 2-3 weeks.  Any sutures or staples will be removed at the office during your follow-up visit. 8. ACTIVITIES:  You may resume regular daily activities (gradually increasing) beginning the next day.  Wearing a good support bra or sports bra minimizes pain and swelling.  You may have sexual intercourse when it is comfortable. a. You may drive when you no longer are taking prescription pain medication, you can comfortably wear a seatbelt, and you can safely maneuver your car and apply brakes. b. RETURN TO WORK:  ______________________________________________________________________________________ 9. You should see your doctor in the office for a follow-up appointment approximately two weeks after your surgery.  Your doctors nurse will typically make your follow-up appointment when she calls you with your pathology report.  Expect your pathology report 2-3 business days after your surgery.  You may call to check if you do not hear from [place] after three days. 10. OTHER INSTRUCTIONS: __OK TO SHOWER STARTING TOMORROW 11. ICE PACK, TYLENOL FOR PAIN 12. NO VIGOROUS ACTIVITY FOR ONE WEEK 13. _____________________________________________________________________________________________ _____________________________________________________________________________________________________________________________________ _____________________________________________________________________________________________________________________________________ _____________________________________________________________________________________________________________________________________  WHEN TO CALL YOUR DOCTOR: 1. Fever over 101.0 2. Nausea and/or vomiting. 3. Extreme swelling or bruising. 4. Continued bleeding from incision. 5. Increased pain, redness, or drainage from the incision.  The clinic staff is available to answer your questions during regular business hours.  Please dont hesitate to call and ask to  speak to one of the nurses for clinical concerns.  If you have a medical emergency, go to the nearest emergency room or call 911.  A surgeon  from [facility] is always on call at the hospital.  For further questions, please visit [email or web address]        Post Anesthesia Home Care Instructions  Activity: Get plenty of rest for the remainder of the day. A responsible individual must stay with you for 24 hours following the procedure.  For the next 24 hours, DO NOT: -Drive a car -Paediatric nurse -Drink alcoholic beverages -Take any medication unless instructed by your physician -Make any legal decisions or sign important papers.  Meals: Start with liquid foods such as gelatin or soup. Progress to regular foods as tolerated. Avoid greasy, spicy, heavy foods. If nausea and/or vomiting occur, drink only clear liquids until the nausea and/or vomiting subsides. Call your physician if vomiting continues.  Special Instructions/Symptoms: Your throat may feel dry or sore from the anesthesia or the breathing tube placed in your throat during surgery. If this causes discomfort, gargle with warm salt water. The discomfort should disappear within 24 hours.  If you had a scopolamine patch placed behind your ear for the management of post- operative nausea and/or vomiting:  1. The medication in the patch is effective for 72 hours, after which it should be removed.  Wrap patch in a tissue and discard in the trash. Wash hands thoroughly with soap and water. 2. You may remove the patch earlier than 72 hours if you experience unpleasant side effects which may include dry mouth, dizziness or visual disturbances. 3. Avoid touching the patch. Wash your hands with soap and water after contact with the patch.

## 2018-03-09 NOTE — Anesthesia Procedure Notes (Signed)
Procedure Name: LMA Insertion Date/Time: 03/09/2018 7:24 AM Performed by: Signe Colt, CRNA Pre-anesthesia Checklist: Patient identified, Emergency Drugs available, Suction available and Patient being monitored Patient Re-evaluated:Patient Re-evaluated prior to induction Oxygen Delivery Method: Circle system utilized Preoxygenation: Pre-oxygenation with 100% oxygen Induction Type: IV induction Ventilation: Mask ventilation without difficulty LMA: LMA inserted LMA Size: 4.0 Number of attempts: 1 Airway Equipment and Method: Bite block Placement Confirmation: positive ETCO2 Tube secured with: Tape Dental Injury: Teeth and Oropharynx as per pre-operative assessment

## 2018-03-09 NOTE — Anesthesia Postprocedure Evaluation (Signed)
Anesthesia Post Note  Patient: Ashley Gallagher  Procedure(s) Performed: LEFT BREAST LUMPECTOMY (Left )     Patient location during evaluation: PACU Anesthesia Type: General Level of consciousness: awake and alert Pain management: pain level controlled Vital Signs Assessment: post-procedure vital signs reviewed and stable Respiratory status: spontaneous breathing, nonlabored ventilation, respiratory function stable and patient connected to nasal cannula oxygen Cardiovascular status: blood pressure returned to baseline and stable Postop Assessment: no apparent nausea or vomiting Anesthetic complications: no    Last Vitals:  Vitals:   03/09/18 0815 03/09/18 0825  BP: 109/69 120/64  Pulse: 65 74  Resp: 13 15  Temp:    SpO2: 100% 100%    Last Pain:  Vitals:   03/09/18 0815  TempSrc:   PainSc: 0-No pain                 Necie Wilcoxson

## 2018-03-09 NOTE — Op Note (Signed)
LEFT BREAST LUMPECTOMY  Procedure Note  Ashley Gallagher 03/09/2018   Pre-op Diagnosis: LEFT BREAST MASS     Post-op Diagnosis: same  Procedure(s): LEFT BREAST LUMPECTOMY  Surgeon(s): Coralie Keens, MD  Anesthesia: General  Staff:  Circulator: Philomena Doheny, RN Scrub Person: Lorenza Burton, CST  Estimated Blood Loss: Minimal               Specimens: sent to path  Indications: This is a 47 year old female who presents with a left breast mass.  A stereotactic biopsy several years ago showed this to be a fibroadenoma.  It is now increasing in size of the 2.6 cm and causing discomfort so the decision has been made to proceed with a lumpectomy  Procedure: The patient was brought to the operating room and identified as correct patient.  She was placed supine on the operating table and general anesthesia was induced.  Her left breast was then prepped and draped in the usual sterile fashion.  I anesthetized the skin at the upper edge of the areola with Marcaine.  I then made an incision with a scalpel.  The mass was located at the 12 o'clock position near the areola.  I was able to easily identify the mass which appeared consistent with a fibroadenoma.  I performed a lumpectomy completely excising the mass.  It was then sent to pathology for evaluation.  I achieved hemostasis with the cautery.  I anesthetized the wound further with Marcaine.  I then closed the subcutaneous tissue with interrupted 3-0 Vicryl sutures and closed the skin with a running 4-0 Monocryl.  Dermabond was then applied.  The patient tolerated the procedure well.  All the counts were correct at the end of the procedure.  The patient was then extubated in the operating room and taken in a stable condition to the recovery room.          [name]   Date: 03/09/2018  Time: 7:49 AM

## 2018-03-09 NOTE — Transfer of Care (Signed)
Immediate Anesthesia Transfer of Care Note  Patient: Ashley Gallagher  Procedure(s) Performed: LEFT BREAST LUMPECTOMY (Left )  Patient Location: PACU  Anesthesia Type:General  Level of Consciousness: drowsy and patient cooperative  Airway & Oxygen Therapy: Patient Spontanous Breathing and Patient connected to face mask oxygen  Post-op Assessment: Report given to RN and Post -op Vital signs reviewed and stable  Post vital signs: Reviewed and stable  Last Vitals:  Vitals Value Taken Time  BP    Temp    Pulse 71 03/09/2018  7:52 AM  Resp 14 03/09/2018  7:52 AM  SpO2 100 % 03/09/2018  7:52 AM  Vitals shown include unvalidated device data.  Last Pain:  Vitals:   03/09/18 0643  TempSrc: Oral  PainSc: 0-No pain         Complications: No apparent anesthesia complications

## 2018-03-09 NOTE — Interval H&P Note (Signed)
History and Physical Interval Note:no change in H and P  03/09/2018 7:01 AM  Ashley Gallagher  has presented today for surgery, with the diagnosis of LEFT BREAST MASS  The various methods of treatment have been discussed with the patient and family. After consideration of risks, benefits and other options for treatment, the patient has consented to  Procedure(s): LEFT BREAST LUMPECTOMY (Left) as a surgical intervention .  The patient's history has been reviewed, patient examined, no change in status, stable for surgery.  I have reviewed the patient's chart and labs.  Questions were answered to the patient's satisfaction.     Amaan Meyer A

## 2018-03-12 ENCOUNTER — Encounter (HOSPITAL_BASED_OUTPATIENT_CLINIC_OR_DEPARTMENT_OTHER): Payer: Self-pay | Admitting: Surgery

## 2018-04-28 ENCOUNTER — Emergency Department (INDEPENDENT_AMBULATORY_CARE_PROVIDER_SITE_OTHER)
Admission: EM | Admit: 2018-04-28 | Discharge: 2018-04-28 | Disposition: A | Discharge status: Home / Self Care | Payer: 59 | Source: Home / Self Care | Attending: Family Medicine | Admitting: Family Medicine

## 2018-04-28 ENCOUNTER — Emergency Department (INDEPENDENT_AMBULATORY_CARE_PROVIDER_SITE_OTHER): Payer: 59

## 2018-04-28 ENCOUNTER — Other Ambulatory Visit: Payer: Self-pay

## 2018-04-28 DIAGNOSIS — T148XXA Other injury of unspecified body region, initial encounter: Secondary | ICD-10-CM

## 2018-04-28 DIAGNOSIS — M545 Low back pain: Secondary | ICD-10-CM | POA: Diagnosis not present

## 2018-04-28 DIAGNOSIS — M5442 Lumbago with sciatica, left side: Secondary | ICD-10-CM

## 2018-04-28 DIAGNOSIS — M533 Sacrococcygeal disorders, not elsewhere classified: Secondary | ICD-10-CM

## 2018-04-28 MED ORDER — PREDNISONE 20 MG PO TABS: ORAL_TABLET | ORAL | 0 refills | Status: DC

## 2018-04-28 NOTE — ED Triage Notes (Signed)
Pt c/o back pain that has worsened in the last 3 days. Radiates down LT leg. Pt mentioned falling on Halloween and noticed minor back pain started then. Taking motrin and tramadol prn. Muscle relaxer has not helped. Pain 6-7/10

## 2018-04-28 NOTE — ED Provider Notes (Signed)
[name]    CSN: [id number] Arrival date & time: 04/28/18  1236     History   Chief Complaint Chief Complaint  Patient presents with  . Back Pain    HPI Ashley Gallagher is a 47 y.o. female.   Patient reports that she slipped on rocks about 3 weeks ago but had only mild lower back ache at that time.  One week ago she developed increasing left lower back ache that began to radiate down to her left thigh.   She denies bowel or bladder dysfunction, and no saddle numbness.  She has been taking Ibuprofen 800mg  and tramadol with mild decrease in pain. She has a history of scoliosis as a child.  The history is provided by the patient.  Back Pain  Location:  Lumbar spine, sacro-iliac joint and gluteal region Quality:  Aching Radiates to:  L knee Pain severity:  Moderate Pain is:  Same all the time Onset quality:  Gradual Duration:  3 weeks Timing:  Constant Progression:  Worsening Chronicity:  New Context: falling   Relieved by:  Nothing Worsened by:  Ambulation and movement Ineffective treatments:  Muscle relaxants Associated symptoms: no abdominal pain, no abdominal swelling, no bladder incontinence, no bowel incontinence, no dysuria, no fever, no leg pain, no numbness, no paresthesias, no pelvic pain, no perianal numbness, no tingling, no weakness and no weight loss     Past Medical History:  Diagnosis Date  . Left breast mass   . Thyroid disease     Active problems:  None   Past Surgical History:  Procedure Laterality Date  . BREAST BIOPSY    . BREAST LUMPECTOMY Left 03/09/2018   Procedure: LEFT BREAST LUMPECTOMY;  Surgeon: Coralie Keens, MD;  Location: [facility];  Service: General;  Laterality: Left;  . CESAREAN SECTION    . CESAREAN SECTION WITH BILATERAL TUBAL LIGATION    . THYROIDECTOMY, PARTIAL    . TUBAL LIGATION    . uterine ablation    . WISDOM TOOTH EXTRACTION      OB History   None      Home Medications     Prior to Admission medications   Medication Sig Start Date End Date Taking? Authorizing Provider  cholecalciferol (VITAMIN D) 1000 units tablet Take 1,000 Units by mouth daily.    [provider]  ibuprofen (ADVIL,MOTRIN) 400 MG tablet Take 400 mg by mouth every 6 (six) hours as needed.    [provider]  predniSONE (DELTASONE) 20 MG tablet Take one tab by mouth twice daily for 4 days, then one daily. Take with food. 04/28/18   Kandra Nicolas, MD  traMADol (ULTRAM) 50 MG tablet Take 1 tablet (50 mg total) by mouth every 6 (six) hours as needed for moderate pain or severe pain. 03/09/18   Coralie Keens, MD  vitamin B-12 (CYANOCOBALAMIN) 100 MCG tablet Take 100 mcg by mouth daily.    [provider]    Family History Family History  Problem Relation Age of Onset  . Diabetes Father   . Heart failure Father     Social History Social History   Tobacco Use  . Smoking status: Never Smoker  . Smokeless tobacco: Never Used  Substance Use Topics  . Alcohol use: Yes    Comment: social  . Drug use: No     Allergies   Bee venom and Morphine and related   Review of Systems Review of Systems  Constitutional: Negative for fever  and weight loss.  Gastrointestinal: Negative for abdominal pain and bowel incontinence.  Genitourinary: Negative for bladder incontinence, dysuria and pelvic pain.  Musculoskeletal: Positive for back pain.  Neurological: Negative for tingling, weakness, numbness and paresthesias.  All other systems reviewed and are negative.    Physical Exam Triage Vital Signs ED Triage Vitals [04/28/18 1321]  Enc Vitals Group     BP 119/75     Pulse Rate 85     Resp 18     Temp 97.9 F (36.6 C)     Temp Source Oral     SpO2 99 %     Weight 196 lb (88.9 kg)     Height 5\' 10"  (1.778 m)     Head Circumference      Peak Flow      Pain Score 6     Pain Loc      Pain Edu?      Excl. in [place]?    No data found.  Updated Vital  Signs BP 119/75 (BP Location: Right Arm)   Pulse 85   Temp 97.9 F (36.6 C) (Oral)   Resp 18   Ht 5\' 10"  (1.778 m)   Wt 88.9 kg   SpO2 99%   BMI 28.12 kg/m   Visual Acuity Right Eye Distance:   Left Eye Distance:   Bilateral Distance:    Right Eye Near:   Left Eye Near:    Bilateral Near:     Physical Exam  Constitutional: She appears well-developed and well-nourished. No distress.  HENT:  Head: Normocephalic.  Right Ear: External ear normal.  Left Ear: External ear normal.  Nose: Nose normal.  Mouth/Throat: Oropharynx is clear and moist.  Eyes: Pupils are equal, round, and reactive to light. Conjunctivae are normal.  Neck: Normal range of motion.  Cardiovascular: Normal heart sounds.  Pulmonary/Chest: Breath sounds normal.  Abdominal: There is no tenderness.  Musculoskeletal: She exhibits no edema.       Back:       Legs: Back:  Can heel/toe walk and squat without difficulty.  Decreased forward flexion.  Tenderness in the left paraspinous muscles from L4 to Sacral area.  Straight leg raising test is negative.  Sitting knee extension test is negative.  Strength and sensation in the lower extremities is normal.  Patellar and achilles reflexes are normal. Note that left leg appears to be about 2cm shorter than the right by inspection.  Left hip reveals distinct tenderness over the greater trochanter.  Palpating the greater trochanter during resisted lateral abduction of the hip recreates pain.   Neurological: She is alert.  Skin: Skin is warm and dry. No rash noted.  Nursing note and vitals reviewed.    UC Treatments / Results  Labs (all labs ordered are listed, but only abnormal results are displayed) Labs Reviewed - No data to display  EKG None  Radiology Dg Lumbar Spine Complete  Result Date: 04/28/2018 CLINICAL DATA:  Gradually and placing back pain for 3 weeks EXAM: LUMBAR SPINE - COMPLETE 4+ VIEW COMPARISON:  None. FINDINGS: Slight lumbar levocurvature.  Mild spondylosis. No disc height loss or facet spurring. IMPRESSION: Negative. Electronically Signed   By: Monte Fantasia M.D.   On: 04/28/2018 14:43    Procedures Procedures (including critical care time)  Medications Ordered in UC Medications - No data to display  Initial Impression / Assessment and Plan / UC Course  I have reviewed the triage vital signs and the nursing notes.  Pertinent  labs & imaging results that were available during my care of the patient were reviewed by me and considered in my medical decision making (see chart for details).    Begin prednisone burst/taper. Followup with Dr. Aundria Mems or Dr. Lynne Leader ([facility]) if not improving about two weeks.    Final Clinical Impressions(s) / UC Diagnoses   Final diagnoses:  Acute bilateral low back pain with left-sided sciatica  Muscle strain  Pain of left sacroiliac joint     Discharge Instructions      Apply ice to the lower back: ? Put ice in a plastic bag. ? Place a towel between your skin and the bag. ? Leave the ice on for 20 minutes, 2-3 times a day.  Begin range of motion and stretching exercises as tolerated.  May continue tramadol at bedtime as needed for pain.    ED Prescriptions    Medication Sig Dispense Auth. Provider   predniSONE (DELTASONE) 20 MG tablet Take one tab by mouth twice daily for 4 days, then one daily. Take with food. 12 tablet Kandra Nicolas, MD        Kandra Nicolas, MD 05/04/18 [phone]

## 2018-04-28 NOTE — Discharge Instructions (Addendum)
Apply ice to the lower back: Put ice in a plastic bag. Place a towel between your skin and the bag. Leave the ice on for 20 minutes, 2-3 times a day. Begin range of motion and stretching exercises as tolerated. May continue tramadol at bedtime as needed for pain.

## 2019-03-21 ENCOUNTER — Encounter: Payer: Self-pay | Admitting: Osteopathic Medicine

## 2019-03-21 ENCOUNTER — Other Ambulatory Visit: Payer: Self-pay

## 2019-03-21 ENCOUNTER — Ambulatory Visit (INDEPENDENT_AMBULATORY_CARE_PROVIDER_SITE_OTHER): Payer: 59 | Admitting: Osteopathic Medicine

## 2019-03-21 VITALS — BP 121/73 | HR 72 | Ht 70.0 in | Wt 195.0 lb

## 2019-03-21 DIAGNOSIS — Z9889 Other specified postprocedural states: Secondary | ICD-10-CM

## 2019-03-21 DIAGNOSIS — E890 Postprocedural hypothyroidism: Secondary | ICD-10-CM | POA: Insufficient documentation

## 2019-03-21 DIAGNOSIS — N644 Mastodynia: Secondary | ICD-10-CM | POA: Diagnosis not present

## 2019-03-21 DIAGNOSIS — Z9009 Acquired absence of other part of head and neck: Secondary | ICD-10-CM | POA: Diagnosis not present

## 2019-03-21 DIAGNOSIS — Z8269 Family history of other diseases of the musculoskeletal system and connective tissue: Secondary | ICD-10-CM

## 2019-03-21 DIAGNOSIS — L918 Other hypertrophic disorders of the skin: Secondary | ICD-10-CM

## 2019-03-21 DIAGNOSIS — Z113 Encounter for screening for infections with a predominantly sexual mode of transmission: Secondary | ICD-10-CM

## 2019-03-21 DIAGNOSIS — R768 Other specified abnormal immunological findings in serum: Secondary | ICD-10-CM

## 2019-03-21 DIAGNOSIS — D229 Melanocytic nevi, unspecified: Secondary | ICD-10-CM

## 2019-03-21 NOTE — Progress Notes (Signed)
HPI: Ashley Gallagher is a 48 y.o. female who  has a past medical history of Left breast mass and Thyroid disease.  she presents to [facility] today, 03/21/19,  for chief complaint of: New to establish  Moles/tags on armpit one each side   Hx partial thyroidectomy. Fatigue, hair loss lately. Hx borderline labs at [facility]  Recent biometric screening showed no major concerns.  Sugars 90, total cholesterol 179, triglycerides 60, HDL 61, LDL 103.   Has a skin tag in right armpit, mole on left armpit.  Reports history of R lumpectomy for benign breast lump.  Reports right-sided breast pain ongoing for about 3 weeks.      Past medical, surgical, social and family history reviewed:  Patient Active Problem List   Diagnosis Date Noted  . History of partial thyroidectomy 03/21/2019  . Routine screening for STI (sexually transmitted infection) 03/21/2019  . S/P lumpectomy, left breast 03/21/2019  . Breast pain, right 03/21/2019  . Benign mole 03/21/2019  . Skin tag 03/21/2019    Past Surgical History:  Procedure Laterality Date  . BREAST BIOPSY    . BREAST LUMPECTOMY Left 03/09/2018   Procedure: LEFT BREAST LUMPECTOMY;  Surgeon: Coralie Keens, MD;  Location: [facility];  Service: General;  Laterality: Left;  . CESAREAN SECTION    . CESAREAN SECTION WITH BILATERAL TUBAL LIGATION    . THYROIDECTOMY, PARTIAL    . TUBAL LIGATION    . uterine ablation    . WISDOM TOOTH EXTRACTION      Social History   Tobacco Use  . Smoking status: Former Smoker    Quit date: 06/07/2015    Years since quitting: 3.7  . Smokeless tobacco: Never Used  Substance Use Topics  . Alcohol use: Yes    Comment: 1-2 week    Family History  Problem Relation Age of Onset  . Diabetes Father   . Heart failure Father      Current medication list and allergy/intolerance information reviewed:    Current Outpatient Medications  Medication Sig Dispense  Refill  . cholecalciferol (VITAMIN D) 1000 units tablet Take 1,000 Units by mouth daily.    Marland Kitchen ibuprofen (ADVIL,MOTRIN) 400 MG tablet Take 400 mg by mouth every 6 (six) hours as needed.    . Multiple Vitamins-Minerals (MULTIVITAMIN WITH MINERALS) tablet Take 1 tablet by mouth daily.    . vitamin B-12 (CYANOCOBALAMIN) 100 MCG tablet Take 100 mcg by mouth daily.     No current facility-administered medications for this visit.     Allergies  Allergen Reactions  . Bee Venom   . Morphine And Related Itching  . Sulfamethoxazole-Trimethoprim Rash      Review of Systems:  Constitutional:  No  fever, no chills, No recent illness, No unintentional weight changes. No significant fatigue.   HEENT: No  headache, no vision change, no hearing change, No sore throat, No  sinus pressure  Cardiac: No  chest pain, No  pressure, No palpitations, No  Orthopnea  Respiratory:  No  shortness of breath. No  Cough  Gastrointestinal: No  abdominal pain, No  nausea, No  vomiting,  No  blood in stool, No  diarrhea, No  constipation   Musculoskeletal: No new myalgia/arthralgia  Skin: No  Rash, +other wounds/concerning lesions  Genitourinary: No  incontinence, No  abnormal genital bleeding, No abnormal genital discharge  Hem/Onc: No  easy bruising/bleeding, No  abnormal lymph node  Endocrine: No cold intolerance,  No  heat intolerance. No polyuria/polydipsia/polyphagia   Neurologic: No  weakness, No  dizziness, No  slurred speech/focal weakness/facial droop  Psychiatric: No  concerns with depression, No  concerns with anxiety, No sleep problems, No mood problems  Exam:  BP (!) 147/87   Pulse 85   Ht 5\' 10"  (1.778 m)   Wt 195 lb (88.5 kg)   SpO2 100%   BMI 27.98 kg/m   Constitutional: VS see above. General Appearance: alert, well-developed, well-nourished, NAD  Eyes: Normal lids and conjunctive, non-icteric sclera  Ears, Nose, Mouth, Throat: MMM, Normal external inspection  ears/nares/mouth/lips/gums. TM normal bilaterally. Pharynx/tonsils no erythema, no exudate. Nasal mucosa normal.   Neck: No masses, trachea midline. No thyroid enlargement. No tenderness/mass appreciated. No lymphadenopathy  Respiratory: Normal respiratory effort. no wheeze, no rhonchi, no rales  Cardiovascular: S1/S2 normal, no murmur, no rub/gallop auscultated. RRR. No lower extremity edema  Gastrointestinal: Nontender, no masses. No hepatomegaly, no splenomegaly. No hernia appreciated. Bowel sounds normal. Rectal exam deferred.   Musculoskeletal: Gait normal. No clubbing/cyanosis of digits.   Neurological: Normal balance/coordination. No tremor. No cranial nerve deficit on limited exam. Motor and sensation intact and symmetric. Cerebellar reflexes intact.   Skin: warm, dry, intact.  Benign-appearing mole on left axilla, skin tag on right axilla.  Procedure note: Skin was anesthetized after stabilization with about 1/2 cc of lidocaine was injected subcutaneously below mole on left axilla and skin tag on right axilla, both of these are pendulous lesions on a stalk.  Anesthesia was obtained, lesions were removed with scissors, chemical cautery was applied with silver nitrate, hemostasis achieved, bandages in place, patient tolerated procedure well.  Psychiatric: Normal judgment/insight. Normal mood and affect. Oriented x3.    Recent Results (from the past 2160 hour(s))  HIV Antibody (routine testing w rflx)     Status: None   Collection Time: 03/21/19  3:55 PM  Result Value Ref Range   HIV 1&2 Ab, 4th Generation NON-REACTIVE NON-REACTI    Comment: HIV-1 antigen and HIV-1/HIV-2 antibodies were not detected. There is no laboratory evidence of HIV infection. Marland Kitchen PLEASE NOTE: This information has been disclosed to you from records whose confidentiality may be protected by state law.  If your state requires such protection, then the state law prohibits you from making any further  disclosure of the information without the specific written consent of the person to whom it pertains, or as otherwise permitted by law. A general authorization for the release of medical or other information is NOT sufficient for this purpose. . For additional information please refer to http://education.[employer].com/faq/FAQ106 (This link is being provided for informational/ educational purposes only.) . Marland Kitchen The performance of this assay has not been clinically validated in patients less than 83 years old. .   RPR     Status: Abnormal   Collection Time: 03/21/19  3:55 PM  Result Value Ref Range   RPR Ser Ql REACTIVE (A) NON-REACTI  TSH     Status: None   Collection Time: 03/21/19  3:55 PM  Result Value Ref Range   TSH 2.71 mIU/L    Comment:           Reference Range .           > or = 20 Years  0.40-4.50 .                Pregnancy Ranges           First trimester    0.26-2.66  Second trimester   0.55-2.73           Third trimester    0.43-2.91   T4, free     Status: None   Collection Time: 03/21/19  3:55 PM  Result Value Ref Range   Free T4 1.0 0.8 - 1.8 ng/dL  Rpr titer     Status: Abnormal   Collection Time: 03/21/19  3:55 PM  Result Value Ref Range   RPR Titer 1:1 (H)   Fluorescent treponemal ab(fta)-IgG-bld     Status: None   Collection Time: 03/21/19  3:55 PM  Result Value Ref Range   Fluorescent Treponemal ABS NON-REACTIVE NON-REACTI     ASSESSMENT/PLAN: The primary encounter diagnosis was History of partial thyroidectomy. Diagnoses of Routine screening for STI (sexually transmitted infection), S/P lumpectomy, left breast, Breast pain, right, Benign mole, Skin tag, Biological false positive RPR test, and Family history of systemic lupus erythematosus were also pertinent to this visit.   See lab result notes for full details.  RPR was positive but confirmatory testing was negative.  Patient denies exposure to potential infection, has been  monogamous with partner for years and just wanted testing as a matter of course.  Will begin work-up for lupus, given family history and the possibility that this may cause a false RPR in some patients.  Difficult amount of time was spent on the phone, see result notes for full details.  Orders Placed This Encounter  Procedures  . C. trachomatis/N. gonorrhoeae RNA  . US BREAST LTD UNI RIGHT INC AXILLA  . US BREAST LTD UNI LEFT INC AXILLA  . MM DIAG BREAST TOMO BILATERAL  . TSH  . T4, free  . HIV Antibody (routine testing w rflx)  . RPR  . TSH  . T4, free  . Rpr titer  . Fluorescent treponemal ab(fta)-IgG-bld  . Protein Electrophoresis, (serum)  . Urinalysis, Routine w reflex microscopic  . ANA  . Anti-DNA antibody, double-stranded  . Antiphospholopid Ab Panel  . Protein / creatinine ratio, urine  . Sedimentation rate  . High sensitivity CRP    No orders of the defined types were placed in this encounter.   Patient Instructions  General Preventive Care  Most recent routine screening lipids/other labs: cholesterol and sugars ok. Will update everything else!   Everyone should have blood pressure checked once per year.   Tobacco: don't!   Alcohol: responsible moderation is ok for most adults - if you have concerns about your alcohol intake, please talk to me!   Exercise: as tolerated to reduce risk of cardiovascular disease and diabetes. Strength training will also prevent osteoporosis.   Mental health: if need for mental health care (medicines, counseling, other), or concerns about moods, please let me know!   Sexual health: if need for STD testing, or if concerns with libido/pain problems, please let me know!   Advanced Directive: Living Will and/or Healthcare Power of Attorney recommended for all adults, regardless of age or health.  Vaccines  Flu vaccine: recommended for almost everyone, every fall.   Shingles vaccine: Shingrix recommended after age 64.    Pneumonia vaccines: Prevnar and Pneumovax recommended after age 51, or sooner if certain medical conditions.  Tetanus booster: Tdap recommended every 10 years.  Cancer screenings   Colon cancer screening: recommended for everyone at age 52-75  Breast cancer screening: mammogram recommended at age 8   Cervical cancer screening: Pap every 1 to 5 years depending on age and other risk factors. Can usually  stop at age 6 or w/ hysterectomy.   Lung cancer screening: not needed for non-smokers  Infection screenings . HIV: recommended screening at least once age 23-65, more often as needed. . Gonorrhea/Chlamydia: screening as needed . Hepatitis C: recommended for anyone born 64-1966 . TB: certain at-risk populations, or depending on work requirements and/or travel history Other . Bone Density Test: recommended for women at age 108            Visit summary with medication list and pertinent instructions was printed for patient to review. All questions at time of visit were answered - patient instructed to contact office with any additional concerns or updates. ER/RTC precautions were reviewed with the patient.   Note: Total time spent 60 minutes, greater than 50% of the visit was spent face-to-face counseling and coordinating care for the above diagnoses listed in assessment/plan.   Please note: voice recognition software was used to produce this document, and typos may escape review. Please contact Dr. Sheppard Coil for any needed clarifications.     Follow-up plan: Return in about 1 year (around 03/20/2020) for [place] (call week prior to visit for lab orders), see [place] sooner if needed! Marland Kitchen

## 2019-03-21 NOTE — Patient Instructions (Addendum)
General Preventive Care  Most recent routine screening lipids/other labs: cholesterol and sugars ok. Will update everything else!   Everyone should have blood pressure checked once per year.   Tobacco: don't!   Alcohol: responsible moderation is ok for most adults - if you have concerns about your alcohol intake, please talk to me!   Exercise: as tolerated to reduce risk of cardiovascular disease and diabetes. Strength training will also prevent osteoporosis.   Mental health: if need for mental health care (medicines, counseling, other), or concerns about moods, please let me know!   Sexual health: if need for STD testing, or if concerns with libido/pain problems, please let me know!   Advanced Directive: Living Will and/or Healthcare Power of Attorney recommended for all adults, regardless of age or health.  Vaccines  Flu vaccine: recommended for almost everyone, every fall.   Shingles vaccine: Shingrix recommended after age 67.   Pneumonia vaccines: Prevnar and Pneumovax recommended after age 5, or sooner if certain medical conditions.  Tetanus booster: Tdap recommended every 10 years.  Cancer screenings   Colon cancer screening: recommended for everyone at age 29-75  Breast cancer screening: mammogram recommended at age 101   Cervical cancer screening: Pap every 1 to 5 years depending on age and other risk factors. Can usually stop at age 38 or w/ hysterectomy.   Lung cancer screening: not needed for non-smokers  Infection screenings . HIV: recommended screening at least once age 23-65, more often as needed. . Gonorrhea/Chlamydia: screening as needed . Hepatitis C: recommended for anyone born 75-1966 . TB: certain at-risk populations, or depending on work requirements and/or travel history Other . Bone Density Test: recommended for women at age 70

## 2019-03-22 LAB — RPR TITER: RPR Titer: 1:1 {titer} — ABNORMAL HIGH

## 2019-03-22 LAB — HIV ANTIBODY (ROUTINE TESTING W REFLEX): HIV 1&2 Ab, 4th Generation: NONREACTIVE

## 2019-03-22 LAB — TSH: TSH: 2.71 mIU/L

## 2019-03-22 LAB — T4, FREE: Free T4: 1 ng/dL (ref 0.8–1.8)

## 2019-03-22 LAB — RPR: RPR Ser Ql: REACTIVE — AB

## 2019-03-22 LAB — FLUORESCENT TREPONEMAL AB(FTA)-IGG-BLD: Fluorescent Treponemal ABS: NONREACTIVE

## 2019-03-27 LAB — ANTIPHOSPHOLOPID AB PANEL
Anticardiolipin IgA: <11 [APL'U] (ref <=11)
Anticardiolipin IgG: <14 [GPL'U] (ref <=14)
Anticardiolipin IgM: <12 [MPL'U] (ref <=12)
Beta-2 Glyco 1 IgA: <9 SAU (ref <=20)
Beta-2 Glyco 1 IgM: <9 SMU (ref <=20)
Beta-2 Glyco I IgG: <9 SGU (ref <=20)
PHOSPHATIDYLSERINE AB  (IGG): <10 U/mL (ref <10)
PHOSPHATIDYLSERINE AB  (IGM): <25 U/mL (ref <25)
PHOSPHATIDYLSERINE AB (IGA): <20 U/mL (ref <20)

## 2019-03-27 LAB — URINALYSIS, ROUTINE W REFLEX MICROSCOPIC
Bilirubin Urine: NEGATIVE
Glucose, UA: NEGATIVE
Hgb urine dipstick: NEGATIVE
Ketones, ur: NEGATIVE
Leukocytes,Ua: NEGATIVE
Nitrite: NEGATIVE
Protein, ur: NEGATIVE
Specific Gravity, Urine: 1.023 (ref 1.001–1.03)
pH: 6.5 (ref 5.0–8.0)

## 2019-03-27 LAB — PROTEIN ELECTROPHORESIS, SERUM
Albumin ELP: 4.3 g/dL (ref 3.8–4.8)
Alpha 1: 0.3 g/dL (ref 0.2–0.3)
Alpha 2: 0.7 g/dL (ref 0.5–0.9)
Beta 2: 0.3 g/dL (ref 0.2–0.5)
Beta Globulin: 0.4 g/dL (ref 0.4–0.6)
Gamma Globulin: 1 g/dL (ref 0.8–1.7)
Total Protein: 7 g/dL (ref 6.1–8.1)

## 2019-03-27 LAB — SEDIMENTATION RATE: Sed Rate: 6 mm/h (ref 0–20)

## 2019-03-27 LAB — PROTEIN / CREATININE RATIO, URINE
Creatinine, Urine: 128 mg/dL (ref 20–275)
Protein/Creat Ratio: 78 mg/g creat (ref 21–161)
Protein/Creatinine Ratio: 0.078 mg/mg creat (ref 0.021–0.16)
Total Protein, Urine: 10 mg/dL (ref 5–24)

## 2019-03-27 LAB — ANTI-DNA ANTIBODY, DOUBLE-STRANDED: ds DNA Ab: 5 IU/mL — ABNORMAL HIGH

## 2019-03-27 LAB — HIGH SENSITIVITY CRP: hs-CRP: <0.3 mg/L

## 2019-03-27 LAB — ANA: Anti Nuclear Antibody (ANA): NEGATIVE

## 2019-03-28 ENCOUNTER — Encounter: Payer: Self-pay | Admitting: Osteopathic Medicine

## 2019-03-28 NOTE — Addendum Note (Signed)
Addended by: Maryla Morrow on: 03/28/2019 03:35 PM   Modules accepted: Orders

## 2019-03-29 ENCOUNTER — Other Ambulatory Visit: Payer: Self-pay | Admitting: Osteopathic Medicine

## 2019-03-29 DIAGNOSIS — Z9889 Other specified postprocedural states: Secondary | ICD-10-CM

## 2019-03-29 DIAGNOSIS — N644 Mastodynia: Secondary | ICD-10-CM

## 2019-04-09 ENCOUNTER — Other Ambulatory Visit: Payer: 59

## 2019-04-12 ENCOUNTER — Other Ambulatory Visit: Payer: Self-pay

## 2019-04-12 ENCOUNTER — Ambulatory Visit: Payer: 59

## 2019-04-12 ENCOUNTER — Ambulatory Visit
Admission: RE | Admit: 2019-04-12 | Discharge: 2019-04-12 | Disposition: A | Discharge status: Home / Self Care | Payer: 59 | Source: Ambulatory Visit | Attending: Osteopathic Medicine | Admitting: Osteopathic Medicine

## 2019-05-08 ENCOUNTER — Ambulatory Visit (INDEPENDENT_AMBULATORY_CARE_PROVIDER_SITE_OTHER): Payer: 59 | Admitting: Osteopathic Medicine

## 2019-05-08 ENCOUNTER — Encounter: Payer: Self-pay | Admitting: Osteopathic Medicine

## 2019-05-08 VITALS — Temp 97.7°F | Wt 190.0 lb

## 2019-05-08 DIAGNOSIS — U071 COVID-19: Secondary | ICD-10-CM

## 2019-05-08 MED ORDER — ONDANSETRON 8 MG PO TBDP: 8.0000 mg | ORAL_TABLET | Freq: Three times a day (TID) | ORAL | 3 refills | Status: AC | PRN

## 2019-05-08 NOTE — Progress Notes (Signed)
Virtual Visit via Video (App used: Doximity) Note  I connected with      Ashley Gallagher on 05/08/19 at 2:31 PM by a telemedicine application and verified that I am speaking with the correct person using two identifiers.  Patient is at home I am in office   I discussed the limitations of evaluation and management by telemedicine and the availability of in person appointments. The patient expressed understanding and agreed to proceed.  History of Present Illness: Ashley Gallagher is a 48 y.o. female who would like to discuss COVID illness    Symptoms started evening of 05/02/19 Tested positive for COVID test obtained 05/04/19 Has been managing symptoms w/ DayQuil, NyQuil, Ibuprofen.  Hour ago was feeling nauseous and dizzy.      Observations/Objective: Temp 97.7 F (36.5 C) (Oral)   Wt 190 lb (86.2 kg)   BMI 27.26 kg/m  BP Readings from Last 3 Encounters:  03/21/19 121/73  04/28/18 119/75  03/09/18 105/71   Exam: Normal Speech.  NAD  Lab and Radiology Results No results found for this or any previous visit (from the past 72 hour(s)). No results found.     Assessment and Plan: 48 y.o. female with The encounter diagnosis was COVID-19.  No concerns for serious complications  OTC meds ok Advised on stay-at home 10 days from onset, 3 days w/o fever/respiraotry symptoms   PDMP not reviewed this encounter. No orders of the defined types were placed in this encounter.  Meds ordered this encounter  Medications  . ondansetron (ZOFRAN-ODT) 8 MG disintegrating tablet    Sig: Take 1 tablet (8 mg total) by mouth every 8 (eight) hours as needed for nausea.    Dispense:  20 tablet    Refill:  3    Instructions sent via MyChart. If MyChart not available, pt was given option for info via personal e-mail w/ no guarantee of protected health info over unsecured e-mail communication, and MyChart sign-up instructions were sent to patient.   Follow Up Instructions: Return  if symptoms worsen or fail to improve.    I discussed the assessment and treatment plan with the patient. The patient was provided an opportunity to ask questions and all were answered. The patient agreed with the plan and demonstrated an understanding of the instructions.   The patient was advised to call back or seek an in-person evaluation if any new concerns, if symptoms worsen or if the condition fails to improve as anticipated.  15 minutes of non-face-to-face time was provided during this encounter.      . . . . . . . . . . . . . Marland Kitchen                   Historical information moved to improve visibility of documentation.  Past Medical History:  Diagnosis Date  . Left breast mass   . Thyroid disease    Past Surgical History:  Procedure Laterality Date  . BREAST BIOPSY    . BREAST EXCISIONAL BIOPSY Left 01/2018  . BREAST LUMPECTOMY Left 03/09/2018   excisional  . CESAREAN SECTION    . CESAREAN SECTION WITH BILATERAL TUBAL LIGATION    . THYROIDECTOMY, PARTIAL    . TUBAL LIGATION    . uterine ablation    . WISDOM TOOTH EXTRACTION     Social History   Tobacco Use  . Smoking status: Former Smoker    Quit date: 06/07/2015    Years since quitting: 3.9  .  Smokeless tobacco: Never Used  Substance Use Topics  . Alcohol use: Yes    Comment: 1-2 week   family history includes Diabetes in her father; Heart failure in her father.  Medications: Current Outpatient Medications  Medication Sig Dispense Refill  . cholecalciferol (VITAMIN D) 1000 units tablet Take 1,000 Units by mouth daily.    Marland Kitchen ibuprofen (ADVIL,MOTRIN) 400 MG tablet Take 400 mg by mouth every 6 (six) hours as needed.    . Multiple Vitamins-Minerals (MULTIVITAMIN WITH MINERALS) tablet Take 1 tablet by mouth daily.    . vitamin B-12 (CYANOCOBALAMIN) 100 MCG tablet Take 100 mcg by mouth daily.     No current facility-administered medications for this visit.    Allergies  Allergen  Reactions  . Bee Venom   . Morphine And Related Itching  . Sulfamethoxazole-Trimethoprim Rash

## 2019-12-02 ENCOUNTER — Encounter: Payer: Self-pay | Admitting: Nurse Practitioner

## 2019-12-02 ENCOUNTER — Ambulatory Visit (INDEPENDENT_AMBULATORY_CARE_PROVIDER_SITE_OTHER): Payer: 59 | Admitting: Nurse Practitioner

## 2019-12-02 VITALS — BP 98/65 | HR 75 | Temp 98.0°F | Wt 196.0 lb

## 2019-12-02 DIAGNOSIS — N3090 Cystitis, unspecified without hematuria: Secondary | ICD-10-CM

## 2019-12-02 DIAGNOSIS — R638 Other symptoms and signs concerning food and fluid intake: Secondary | ICD-10-CM

## 2019-12-02 DIAGNOSIS — A09 Infectious gastroenteritis and colitis, unspecified: Secondary | ICD-10-CM

## 2019-12-02 LAB — POCT URINALYSIS DIP (CLINITEK)
Glucose, UA: NEGATIVE mg/dL
Nitrite, UA: POSITIVE — AB
POC PROTEIN,UA: 30 — AB
Spec Grav, UA: >=1.03 — AB (ref 1.010–1.025)
Urobilinogen, UA: 1 E.U./dL
pH, UA: 5.5 (ref 5.0–8.0)

## 2019-12-02 MED ORDER — LACTATED RINGERS IV BOLUS: 1000.0000 mL | Freq: Once | INTRAVENOUS | Status: AC

## 2019-12-02 MED ORDER — CIPROFLOXACIN HCL 500 MG PO TABS: 500.0000 mg | ORAL_TABLET | Freq: Two times a day (BID) | ORAL | 1 refills | Status: DC

## 2019-12-02 MED ORDER — ONDANSETRON HCL 8 MG PO TABS: 8.0000 mg | ORAL_TABLET | Freq: Three times a day (TID) | ORAL | 2 refills | Status: AC | PRN

## 2019-12-02 NOTE — Progress Notes (Signed)
Acute Office Visit  Subjective:    Patient ID: Ashley Gallagher, female    DOB: [birth year], 49 y.o.   MRN: [id number]  No chief complaint on file.   HPI Patient is in today for diarrhea with the presence of blood. She reports she was at a family reunion staying at a campsite over the weekend and consumed picnic type foods. She is a vegetarian, but did consume a veggie burger cooked on the same grill as ground beef burgers. Approximately 20 minutes after eating she reports that she became ill with severe stomach cramps and diarrhea. She reports her initial bowel movement was brown, but approximately one hour later she experienced a second bowel movement with bright red blood present. She also had one instance of vomiting, but no blood present in the emesis. She states that she had frequent diarrhea throughout Saturday and Sunday. She did take Zofran for nausea and Imodium to help with the diarrhea. She has not had a bowel movement since last night (Sunday night), but reports her stomach is still cramping and hurts. She had a temperature of 99.6 at one time, but is unsure if it ever went higher. She is experiencing some dizziness when standing quickly.   She also reports that she has had some low back pain for the past 2-3 weeks. She initially thought that she may have injured her lower back, but since the onset of upset stomach, she has noticed her urine is dark and very malodorous. Her back pain is still the same as it was prior.    She has been trying to consume an increased amount of fluids, but report she does feel dehydrated.   She denies shortness of breath, chest pain, loss of consciousness, or recent antibiotics.  No one else at the picnic became ill, however, she and only one other person at the veggie burgers, which were being prepared and cooked with the hamburger meat. She reports the same utensils were being used to flip the burgers, as well. She has had significant gastrointestinal upset  in the past when she has had meat and feels that this may have caused her symptoms.   Past Medical History:  Diagnosis Date  . Left breast mass   . Thyroid disease     Past Surgical History:  Procedure Laterality Date  . BREAST BIOPSY    . BREAST EXCISIONAL BIOPSY Left 01/2018  . BREAST LUMPECTOMY Left 03/09/2018   excisional  . CESAREAN SECTION    . CESAREAN SECTION WITH BILATERAL TUBAL LIGATION    . THYROIDECTOMY, PARTIAL    . TUBAL LIGATION    . uterine ablation    . WISDOM TOOTH EXTRACTION      Family History  Problem Relation Age of Onset  . Diabetes Father   . Heart failure Father     Social History   Socioeconomic History  . Marital status: Single    Spouse name: Not on file  . Number of children: Not on file  . Years of education: Not on file  . Highest education level: Not on file  Occupational History  . Not on file  Tobacco Use  . Smoking status: Former Smoker    Quit date: 06/07/2015    Years since quitting: 4.4  . Smokeless tobacco: Never Used  Vaping Use  . Vaping Use: Never used  Substance and Sexual Activity  . Alcohol use: Yes    Comment: 1-2 week  . Drug use: No  . Sexual activity: Yes  Partners: Male    Birth control/protection: Surgical    Comment: uterine ablation, BTL  Other Topics Concern  . Not on file  Social History Narrative  . Not on file   Social Determinants of Health   Financial Resource Strain:   . Difficulty of Paying Living Expenses:   Food Insecurity:   . Worried About Charity fundraiser in the Last Year:   . Arboriculturist in the Last Year:   Transportation Needs:   . Film/video editor (Medical):   Marland Kitchen Lack of Transportation (Non-Medical):   Physical Activity:   . Days of Exercise per Week:   . Minutes of Exercise per Session:   Stress:   . Feeling of Stress :   Social Connections:   . Frequency of Communication with Friends and Family:   . Frequency of Social Gatherings with Friends and Family:   .  Attends Religious Services:   . Active Member of Clubs or Organizations:   . Attends Archivist Meetings:   Marland Kitchen Marital Status:   Intimate Partner Violence:   . Fear of Current or Ex-Partner:   . Emotionally Abused:   Marland Kitchen Physically Abused:   . Sexually Abused:     Outpatient Medications Prior to Visit  Medication Sig Dispense Refill  . cholecalciferol (VITAMIN D) 1000 units tablet Take 1,000 Units by mouth daily.    Marland Kitchen ibuprofen (ADVIL,MOTRIN) 400 MG tablet Take 400 mg by mouth every 6 (six) hours as needed.    . Multiple Vitamins-Minerals (MULTIVITAMIN WITH MINERALS) tablet Take 1 tablet by mouth daily.    . ondansetron (ZOFRAN-ODT) 8 MG disintegrating tablet Take 1 tablet (8 mg total) by mouth every 8 (eight) hours as needed for nausea. 20 tablet 3  . vitamin B-12 (CYANOCOBALAMIN) 100 MCG tablet Take 100 mcg by mouth daily.     No facility-administered medications prior to visit.    Allergies  Allergen Reactions  . Bee Venom   . Morphine And Related Itching  . Sulfamethoxazole-Trimethoprim Rash      Objective:    Physical Exam Vitals and nursing note reviewed.  Constitutional:      Appearance: She is well-developed. She is ill-appearing.  HENT:     Head: Normocephalic.     Mouth/Throat:     Mouth: Mucous membranes are moist.     Pharynx: Oropharynx is clear. No pharyngeal swelling or oropharyngeal exudate.  Eyes:     Extraocular Movements: Extraocular movements intact.     Pupils: Pupils are equal, round, and reactive to light.  Cardiovascular:     Rate and Rhythm: Normal rate and regular rhythm.     Heart sounds: Normal heart sounds. No murmur heard.   Pulmonary:     Effort: Pulmonary effort is normal. No respiratory distress.     Breath sounds: Normal breath sounds. No rhonchi.  Abdominal:     General: Abdomen is flat. Bowel sounds are increased. There is distension.     Palpations: Abdomen is soft. There is no shifting dullness, fluid wave, hepatomegaly,  splenomegaly, mass or pulsatile mass.     Tenderness: There is abdominal tenderness in the suprapubic area and left lower quadrant. There is no right CVA tenderness, left CVA tenderness, guarding or rebound. Negative signs include Murphy's sign, Rovsing's sign and McBurney's sign.     Hernia: No hernia is present.  Skin:    General: Skin is warm and dry.     Capillary Refill: Capillary refill takes less than  2 seconds.     Coloration: Skin is not pale.  Neurological:     General: No focal deficit present.     Mental Status: She is alert and oriented to person, place, and time.     Motor: Weakness present.  Psychiatric:        Mood and Affect: Mood normal.        Behavior: Behavior normal.     There were no vitals taken for this visit. Wt Readings from Last 3 Encounters:  05/08/19 190 lb (86.2 kg)  03/21/19 195 lb (88.5 kg)  04/28/18 196 lb (88.9 kg)    Health Maintenance Due  Topic Date Due  . Hepatitis C Screening  Never done  . COVID-19 Vaccine (1) Never done  . PAP SMEAR-Modifier  03/05/2017    There are no preventive care reminders to display for this patient.   Lab Results  Component Value Date   TSH 2.71 03/21/2019   No results found for: WBC, HGB, HCT, MCV, PLT Lab Results  Component Value Date   NA 138 08/24/2012   K 4.6 08/24/2012   CO2 22 08/24/2012   GLUCOSE 119 (H) 08/24/2012   BUN 12 08/24/2012   CREATININE 0.67 08/24/2012   BILITOT 0.4 08/24/2012   ALKPHOS 69 08/24/2012   AST 16 08/24/2012   ALT 18 08/24/2012   PROT 7.0 03/25/2019   ALBUMIN 4.7 08/24/2012   CALCIUM 9.4 08/24/2012   No results found for: CHOL No results found for: HDL No results found for: LDLCALC No results found for: TRIG No results found for: CHOLHDL No results found for: HGBA1C     Assessment & Plan:   1. Acute infectious diarrhea Symptoms and presentation consistent with acute infectious diarrhea, suspected from exposure to undercooked/uncooked ground beef. With the  symptoms of dizziness upon standing and her decreased blood pressure, it is likely that she is also experiencing dehydration from increased stool passage and decreased intake. She did take Imodium before being seen and has not had additional bowel movements, but still has abdominal bloating and increased bowel sounds. A stool sample may be beneficial in identifying the exact pathogen to help with treatment, but this is unlikely since she has taken Imodium and has not had additional bowel movements since last night. We discussed attempting to get a stool sample and she was provided with collection cup and order requisition with a specimen collection bag in the event she is able to collect a sample to bring to the lab. Given the severity of her symptoms, we will treat her today with a 1L bolus of lactated ringers solution and she will be treated empirically with a three day course of Ciprofloxacin, given that she is also positive for UTI- this will treat both infections. It is possible the presence of the UTI depleted her defenses enough to make her more susceptible to the gastrointestinal infection.   PLAN: - Cipro 500mg  twice a day for 3 days - If able to obtain sample- please bring stool sample to the lab - Zofran as needed for nausea - Instructions provided not to take Imodium, but Pepto Bismol may be taken for GI symptom relief - If symptoms worsen, bleeding returns, you are unable to hold down liquids, you experience dizziness, shortness of breath, or decreased urine output, let us know or seek emergency evaluation immediately in the urgent care or ED.  - A GI consult may be needed if symptoms persist despite treatment  - Stool Culture - ondansetron (  ZOFRAN) 8 MG tablet; Take 1 tablet (8 mg total) by mouth every 8 (eight) hours as needed for nausea or vomiting. Take 1 tab (8mg ) every 8 hours as needed for nausea.  Dispense: 30 tablet; Refill: 2 - ciprofloxacin (CIPRO) 500 MG tablet; Take 1 tablet  (500 mg total) by mouth 2 (two) times daily. May repeat in 3 days if symptoms persist.  Dispense: 6 tablet; Refill: 1  2. Cystitis Acute cystitis in addition to infectious diarrhea and likely acute kidney injury based on POC UA results. It is likely the cystitis has been present for a while now and may have weakened her defenses against the infectious diarrhea pathogen. We will treat both infections with Ciprofloxacin and provide gentle fluid replacement today with a 1L bolus of LR. If symptoms persist, consider labs to evaluate for AKI.   PLAN: - Cipro 500mg  twice a day for 3 days. May repeat dose if symptoms persist.  - Urine sent for culture - Zofran as needed for nausea - Monitor for worsening symptoms, fever, chills, vomiting, increased blood in stool or urine, decreased urine output, dizziness, lightheadedness, or weakness and notify the office or go to the Urgent Care or ED immediately for evaluation.   - POCT URINALYSIS DIP (CLINITEK) - ondansetron (ZOFRAN) 8 MG tablet; Take 1 tablet (8 mg total) by mouth every 8 (eight) hours as needed for nausea or vomiting. Take 1 tab (8mg ) every 8 hours as needed for nausea.  Dispense: 30 tablet; Refill: 2 - ciprofloxacin (CIPRO) 500 MG tablet; Take 1 tablet (500 mg total) by mouth 2 (two) times daily. May repeat in 3 days if symptoms persist.  Dispense: 6 tablet; Refill: 1 - Urine Culture  3. Dehydration symptoms Symptoms and presentation consistent with dehydration in the setting of acute cystitis and infectious diarrhea. 1L LR bolus given to patient in office via 22g IV catheter in right forearm. Patient tolerated procedure well and reported improvement of symptoms post infusion. IV removed and gauze placed over IV site with no signs of infiltration, ecchymosis, or bleeding.  - lactated ringers bolus 1,000 mL  Return if symptoms worsen or fail to improve.   Orma Render, NP

## 2019-12-02 NOTE — Patient Instructions (Addendum)
Thank you for allowing me to provide care for you today. Please review the following information.   If you are no better in 2 days, please contact the office and notify me.   Be sure to keep yourself very hydrated. Oral rehydration solutions (like Pedialyte) are best for this because they don't have all of the added sugar in drinks like Gatorade.   If you can tolerate food- avoid foods that cause excessive gas and bloating. It is best to start a diet with easy to digest foods like bananas, rice, applesauce, toast, mashed potatoes, etc.   Avoid fried foods and foods with high fat content.   If you cannot keep liquids down or begin to experience dizziness, lightheadedness, little urine output, very dark urine, or weakness, go to the emergency room or urgent care for evaluation. You may need IV replacement of fluids.   Most causes of inflammatory diarrhea (diarrhea with blood) include infection with Salmonella, Campylobactor, Shigella, E. Coli, or [place]. These can take 1 - 8 days to completely resolve depending on the source of infection.   You may take bismuth salicylate (Pepto-Bismol) 30 mL or two tablets every 30 minutes for up to eight doses in a 24 hour period to help slow down some of the diarrhea, if you need to.    Food Poisoning Food poisoning is an illness that is caused by eating or drinking contaminated foods or drinks. In most cases, food poisoning is mild and lasts 1-2 days. However, some cases can be serious, especially for people who have weak body defense systems (immune systems), older people, children and infants, and pregnant women. What are the causes? This condition is caused by contaminated food. Foods can become contaminated with viruses, bacteria, parasites, or mold due to:  Poor personal hygiene, such as poor hand-washing practices.  Storing food improperly, such as not refrigerating raw meat.  Using unclean surfaces for preparing, serving, and storing  food.  Cooking or eating with unclean utensils. If contaminated food is eaten, viruses, bacteria, or parasites can harm the intestine. This often causes severe diarrhea. The most common causes of food poisoning include:  Viruses, such as: ? Norovirus. ? Rotavirus.  Bacteria, such as: ? Salmonella. ? Listeria. ? E. coli (Escherichia coli).  Parasites, such as: ? Giardia. ? Toxoplasma gondii. What are the signs or symptoms? Symptoms may take several hours to appear after you consume contaminated food or drink. Symptoms include:  Nausea.  Vomiting.  Cramping.  Diarrhea.  Fever and chills.  Muscle aches.  Dehydration. Dehydration can cause you to be tired and thirsty, have a dry mouth, and urinate less frequently. How is this diagnosed? Your health care provider can diagnose food poisoning with your medical history and a physical exam. This will include asking you what you have recently eaten. You may also have tests, including:  Blood tests.  Stool tests. How is this treated? Treatment focuses on relieving your symptoms and making sure that you are hydrated. You may also be given medicines. In severe cases, hospitalization may be required and you may need to receive fluids through an IV. Follow these instructions at home: Eating and drinking   Drink enough fluids to keep your urine pale yellow. You may need to drink small amounts of clear liquids frequently.  Avoid milk, caffeine, and alcohol.  Ask your health care provider for specific rehydration instructions.  Eat small, frequent meals rather than large meals. Medicines  Take over-the-counter and prescription medicines only as told by  your health care provider. Ask your health care provider if you should continue to take any of your regular prescribed and over-the-counter medicines.  If you were prescribed an antibiotic medicine, take it as told by your health care provider. Do not stop taking the antibiotic  even if you start to feel better. General instructions   Wash your hands thoroughly before you prepare food and after you go to the bathroom (use the toilet). Make sure that the people who live with you also wash their hands often.  Rest at home until you feel better.  Clean surfaces that you touch with a product that contains chlorine bleach.  Keep all follow-up visits as told by your health care provider. This is important. How is this prevented?  Wash your hands, food preparation surfaces, and utensils thoroughly before and after you handle raw foods.  Use separate food preparation surfaces and storage spaces for raw meat and for fruits and vegetables.  Keep refrigerated foods colder than 25F (5C).  Serve hot foods immediately or keep them heated above 125F (60C).  Store dry foods in cool, dry spaces away from excess heat or moisture. Throw out any foods that do not smell right or are in cans that are bulging.  Follow approved canning procedures.  Heat canned foods thoroughly before you taste them.  Drink bottled or sterile water when you travel. Get help right away if:  You have difficulty breathing, swallowing, talking, or moving.  You develop blurred vision.  You cannot eat or drink without vomiting.  You faint.  Your eyes turn yellow.  Your vomiting or diarrhea is persistent.  Abdominal pain develops, increases, or localizes in one small area.  You have a fever.  You have blood or mucus in your stools, or your stools look dark black and tarry.  You have signs of dehydration, such as: ? Dark urine, very little urine, or no urine. ? Cracked lips. ? Not making tears while crying. ? Dry mouth. ? Sunken eyes. ? Sleepiness. ? Weakness. ? Dizziness. These symptoms may represent a serious problem that is an emergency. Do not wait to see if the symptoms will go away. Get medical help right away. Call your local emergency services (911 in the [place] Do not  drive yourself to the hospital. Summary  Food poisoning is an illness that is caused by eating or drinking contaminated foods or drinks.  Symptoms may include nausea, vomiting, diarrhea, muscle aches, cramping, fever, chills, and dehydration.  In most cases, food poisoning is mild and lasts 1-2 days.  In severe cases, hospitalization may be required. This information is not intended to replace advice given to you by your health care provider. Make sure you discuss any questions you have with your health care provider. Document Revised: 03/07/2018 Document Reviewed: 03/07/2018 Elsevier Patient Education  2020 [employer].

## 2019-12-02 NOTE — Progress Notes (Signed)
22g IV place to the right forearm with 1 attempt, good blood return noted. 1L of Lactated Ringer's infusing with pressure bag. Patient tolerating well.

## 2019-12-04 ENCOUNTER — Encounter: Payer: Self-pay | Admitting: Osteopathic Medicine

## 2019-12-04 ENCOUNTER — Ambulatory Visit (INDEPENDENT_AMBULATORY_CARE_PROVIDER_SITE_OTHER): Payer: 59 | Admitting: Osteopathic Medicine

## 2019-12-04 ENCOUNTER — Encounter: Payer: Self-pay | Admitting: Nurse Practitioner

## 2019-12-04 ENCOUNTER — Other Ambulatory Visit: Payer: Self-pay

## 2019-12-04 ENCOUNTER — Ambulatory Visit (INDEPENDENT_AMBULATORY_CARE_PROVIDER_SITE_OTHER): Payer: 59

## 2019-12-04 VITALS — BP 116/80 | HR 82 | Temp 98.0°F | Wt 201.0 lb

## 2019-12-04 DIAGNOSIS — K56609 Unspecified intestinal obstruction, unspecified as to partial versus complete obstruction: Secondary | ICD-10-CM | POA: Diagnosis not present

## 2019-12-04 DIAGNOSIS — R1032 Left lower quadrant pain: Secondary | ICD-10-CM | POA: Diagnosis not present

## 2019-12-04 DIAGNOSIS — A059 Bacterial foodborne intoxication, unspecified: Secondary | ICD-10-CM | POA: Diagnosis not present

## 2019-12-04 DIAGNOSIS — R140 Abdominal distension (gaseous): Secondary | ICD-10-CM | POA: Diagnosis not present

## 2019-12-04 DIAGNOSIS — K625 Hemorrhage of anus and rectum: Secondary | ICD-10-CM | POA: Diagnosis not present

## 2019-12-04 DIAGNOSIS — K56600 Partial intestinal obstruction, unspecified as to cause: Secondary | ICD-10-CM | POA: Diagnosis not present

## 2019-12-04 LAB — URINE CULTURE
MICRO NUMBER:: 10641969
SPECIMEN QUALITY:: ADEQUATE

## 2019-12-04 LAB — CBC WITH DIFFERENTIAL/PLATELET
Absolute Monocytes: 543 cells/uL (ref 200–950)
Basophils Absolute: 7 cells/uL (ref 0–200)
Basophils Relative: 0.1 %
Eosinophils Absolute: 121 cells/uL (ref 15–500)
Eosinophils Relative: 1.8 %
HCT: 38.6 % (ref 35.0–45.0)
Hemoglobin: 13.1 g/dL (ref 11.7–15.5)
Lymphs Abs: 2580 cells/uL (ref 850–3900)
MCH: 32.3 pg (ref 27.0–33.0)
MCHC: 33.9 g/dL (ref 32.0–36.0)
MCV: 95.3 fL (ref 80.0–100.0)
MPV: 11.2 fL (ref 7.5–12.5)
Monocytes Relative: 8.1 %
Neutro Abs: 3451 cells/uL (ref 1500–7800)
Neutrophils Relative %: 51.5 %
Platelets: 231 10*3/uL (ref 140–400)
RBC: 4.05 10*6/uL (ref 3.80–5.10)
RDW: 11.9 % (ref 11.0–15.0)
Total Lymphocyte: 38.5 %
WBC: 6.7 10*3/uL (ref 3.8–10.8)

## 2019-12-04 LAB — COMPLETE METABOLIC PANEL WITH GFR
AG Ratio: 1.8 (calc) (ref 1.0–2.5)
ALT: 10 U/L (ref 6–29)
AST: 12 U/L (ref 10–35)
Albumin: 4.2 g/dL (ref 3.6–5.1)
Alkaline phosphatase (APISO): 58 U/L (ref 31–125)
BUN: 7 mg/dL (ref 7–25)
CO2: 27 mmol/L (ref 20–32)
Calcium: 9.3 mg/dL (ref 8.6–10.2)
Chloride: 106 mmol/L (ref 98–110)
Creat: 0.7 mg/dL (ref 0.50–1.10)
GFR, Est African American: 118 mL/min/{1.73_m2} (ref >=60)
GFR, Est Non African American: 102 mL/min/{1.73_m2} (ref >=60)
Globulin: 2.3 g/dL (calc) (ref 1.9–3.7)
Glucose, Bld: 99 mg/dL (ref 65–99)
Potassium: 4.5 mmol/L (ref 3.5–5.3)
Sodium: 140 mmol/L (ref 135–146)
Total Bilirubin: 0.3 mg/dL (ref 0.2–1.2)
Total Protein: 6.5 g/dL (ref 6.1–8.1)

## 2019-12-04 MED ORDER — METRONIDAZOLE 500 MG PO TABS: 500.0000 mg | ORAL_TABLET | Freq: Three times a day (TID) | ORAL | 0 refills | Status: DC

## 2019-12-04 MED ORDER — IOHEXOL 300 MG/ML  SOLN
100.0000 mL | Freq: Once | INTRAMUSCULAR | Status: AC | PRN
Start: 2019-12-04 — End: 2019-12-04
  Administered 2019-12-04: 100 mL via INTRAVENOUS

## 2019-12-04 NOTE — Patient Instructions (Addendum)
Given bleeding without diarrhea, that's a little strange for typical infection. I'd like to add an antibiotic called Flagyl to the Cipro, and I'd like to get a CT scan of the abdomen and some blood work to see if anything else is going on. I've placed referral to GI to get an appointment soon - can always cancel this if symptoms resolve.

## 2019-12-04 NOTE — Progress Notes (Signed)
Ashley Gallagher is a 49 y.o. female who presents to  [place] at [facility]  today, 12/04/19, seeking care for the following:  . Abdominal bloating, recently seen & treated for presumed infectious diarrhea d/t likely exposure from contaminated meat, started about 5 days ago not long after eating. Stool culture pending. Treated w/ Cipro bid x3 days for UTI also (pos nitrite, urine culture pending). Taking Zofran prn. No BM recently, still having rectal bleeding. Rectal exam today (+)blood w/o obvious hemorrhoid, no fissue, (+)TTP LLQ      ASSESSMENT & PLAN with other pertinent findings:  The primary encounter diagnosis was Rectal bleeding. Diagnoses of Food poisoning and Partial intestinal obstruction suspected, were also pertinent to this visit.       Patient Instructions   Given bleeding without diarrhea, that's a little strange for typical infection. I'd like to add an antibiotic called Flagyl to the Cipro, and I'd like to get a CT scan of the abdomen and some blood work to see if anything else is going on. I've placed referral to GI to get an appointment soon - can always cancel this if symptoms resolve.    Orders Placed This Encounter  Procedures  . CT Abdomen Pelvis W Contrast  . CBC with Differential/Platelet  . COMPLETE METABOLIC PANEL WITH GFR  . Ambulatory referral to Gastroenterology    Meds ordered this encounter  Medications  . metroNIDAZOLE (FLAGYL) 500 MG tablet    Sig: Take 1 tablet (500 mg total) by mouth 3 (three) times daily.    Dispense:  21 tablet    Refill:  0       Follow-up instructions: Return for RECHECK PENDING RESULTS / IF WORSE OR CHANGE.                                         BP 116/80 (BP Location: Left Arm, Patient Position: Sitting)   Pulse 82   Temp 98 F (36.7 C)   Wt 201 lb (91.2 kg)   SpO2 99%   BMI 28.84 kg/m   Current Meds  Medication Sig  .  cholecalciferol (VITAMIN D) 1000 units tablet Take 1,000 Units by mouth daily.  . ciprofloxacin (CIPRO) 500 MG tablet Take 1 tablet (500 mg total) by mouth 2 (two) times daily. May repeat in 3 days if symptoms persist.  . ibuprofen (ADVIL,MOTRIN) 400 MG tablet Take 400 mg by mouth every 6 (six) hours as needed.  . Multiple Vitamins-Minerals (MULTIVITAMIN WITH MINERALS) tablet Take 1 tablet by mouth daily.  . ondansetron (ZOFRAN) 8 MG tablet Take 1 tablet (8 mg total) by mouth every 8 (eight) hours as needed for nausea or vomiting. Take 1 tab (8mg ) every 8 hours as needed for nausea.  . ondansetron (ZOFRAN-ODT) 8 MG disintegrating tablet Take 1 tablet (8 mg total) by mouth every 8 (eight) hours as needed for nausea.  . vitamin B-12 (CYANOCOBALAMIN) 100 MCG tablet Take 100 mcg by mouth daily.   Current Facility-Administered Medications for the 12/04/19 encounter (Office Visit) with Emeterio Reeve, DO  Medication  . lactated ringers bolus 1,000 mL    Results for orders placed or performed in visit on 12/02/19 (from the past 72 hour(s))  POCT URINALYSIS DIP (CLINITEK)     Status: Abnormal   Collection Time: 12/02/19  5:00 PM  Result Value Ref Range   Color,  UA yellow yellow   Clarity, UA clear clear   Glucose, UA negative negative mg/dL   Bilirubin, UA small (A) negative   Ketones, POC UA trace (5) (A) negative mg/dL   Spec Grav, UA >=1.030 (A) 1.010 - 1.025   Blood, UA moderate (A) negative   pH, UA 5.5 5.0 - 8.0   POC PROTEIN,UA =30 (A) negative, trace   Urobilinogen, UA 1.0 0.2 or 1.0 E.U./dL   Nitrite, UA Positive (A) Negative   Leukocytes, UA Trace (A) Negative    No results found.     All questions at time of visit were answered - patient instructed to contact office with any additional concerns or updates.  ER/RTC precautions were reviewed with the patient as applicable.   Please note: voice recognition software was used to produce this document, and typos may escape  review. Please contact Dr. Sheppard Coil for any needed clarifications.

## 2019-12-04 NOTE — Progress Notes (Signed)
Urine culture was positive for e coli which is sensitive to Cipro. If you continue to have urinary symptoms after finishing the antibiotic, please let us know.

## 2019-12-06 ENCOUNTER — Encounter: Payer: Self-pay | Admitting: Osteopathic Medicine

## 2019-12-10 ENCOUNTER — Encounter: Payer: Self-pay | Admitting: Osteopathic Medicine

## 2019-12-11 MED ORDER — FLUCONAZOLE 150 MG PO TABS
150.0000 mg | ORAL_TABLET | Freq: Once | ORAL | 1 refills | Status: AC
Start: 2019-12-11 — End: 2019-12-11

## 2020-03-03 DIAGNOSIS — Z9851 Tubal ligation status: Secondary | ICD-10-CM | POA: Insufficient documentation

## 2020-03-03 DIAGNOSIS — Z9009 Acquired absence of other part of head and neck: Secondary | ICD-10-CM | POA: Insufficient documentation

## 2020-03-03 DIAGNOSIS — Z9889 Other specified postprocedural states: Secondary | ICD-10-CM | POA: Insufficient documentation

## 2020-10-16 IMAGING — MG DIGITAL DIAGNOSTIC BILAT W/ TOMO W/ CAD
8 series · 8 of 24 positions shown · non-contrast
Comparison: Previous exam(s).

CLINICAL DATA: 47-year-old female presenting for evaluation of
intermittent nonfocal shooting pains in the right breast.

EXAM:
DIGITAL DIAGNOSTIC BILATERAL MAMMOGRAM WITH CAD AND TOMO

[R CC synth-2D]
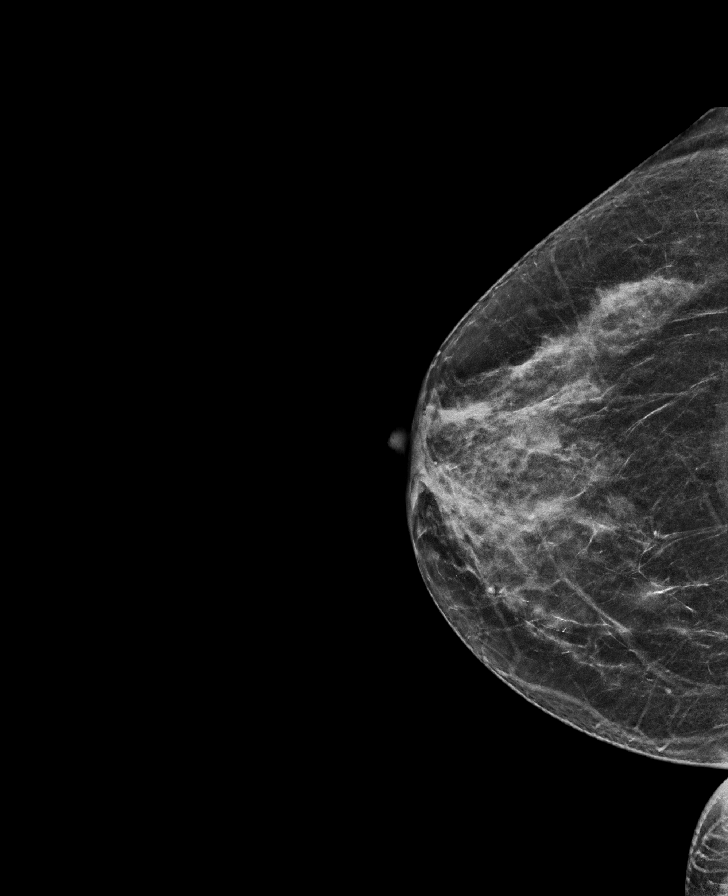

[L CC synth-2D]
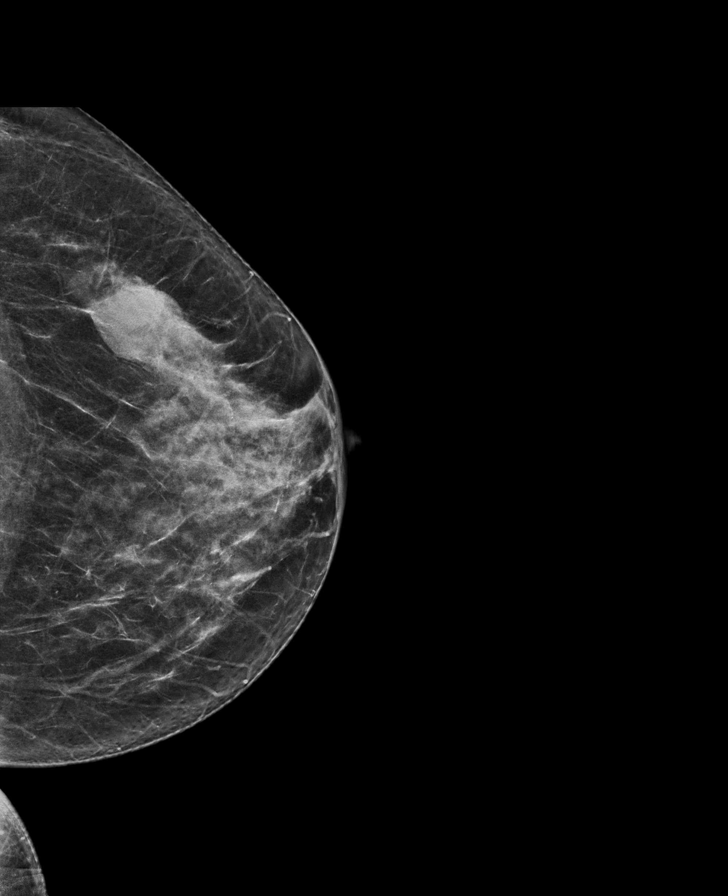

[R MLO synth-2D]
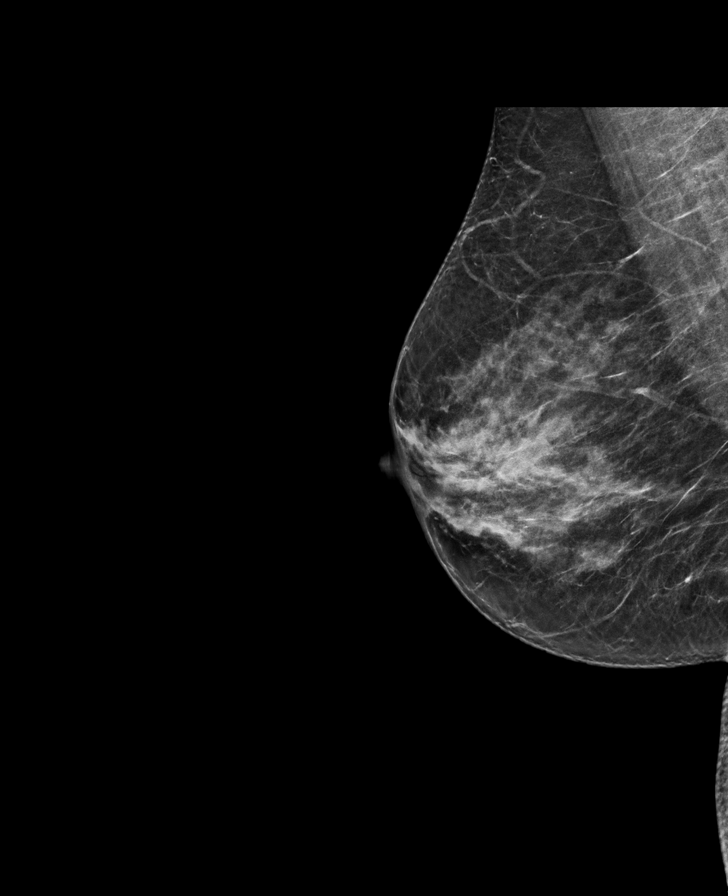

[L MLO synth-2D]
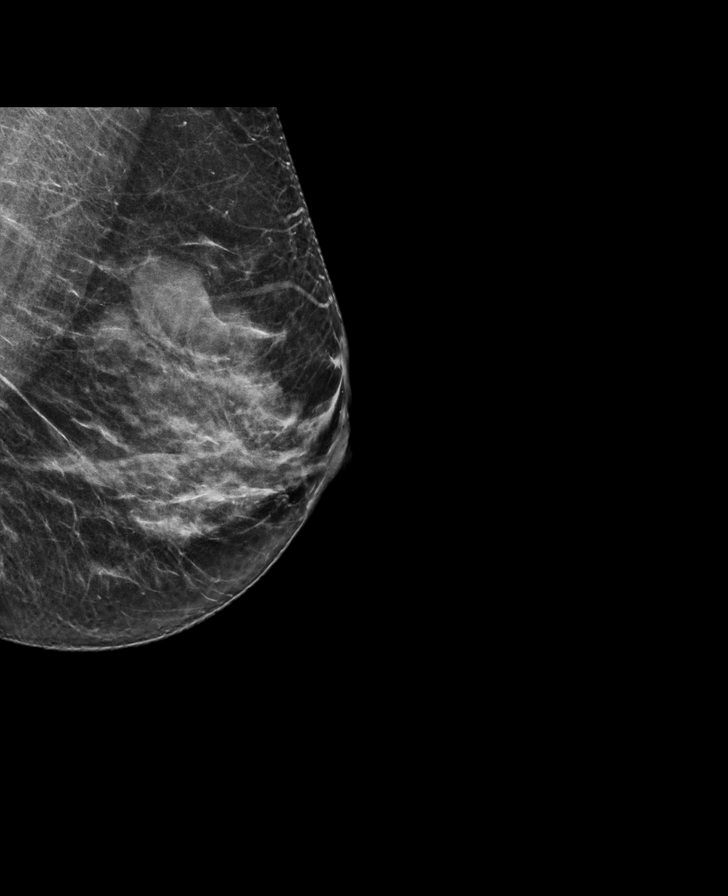

[L CC tomo · tomo slice 33/66.0]
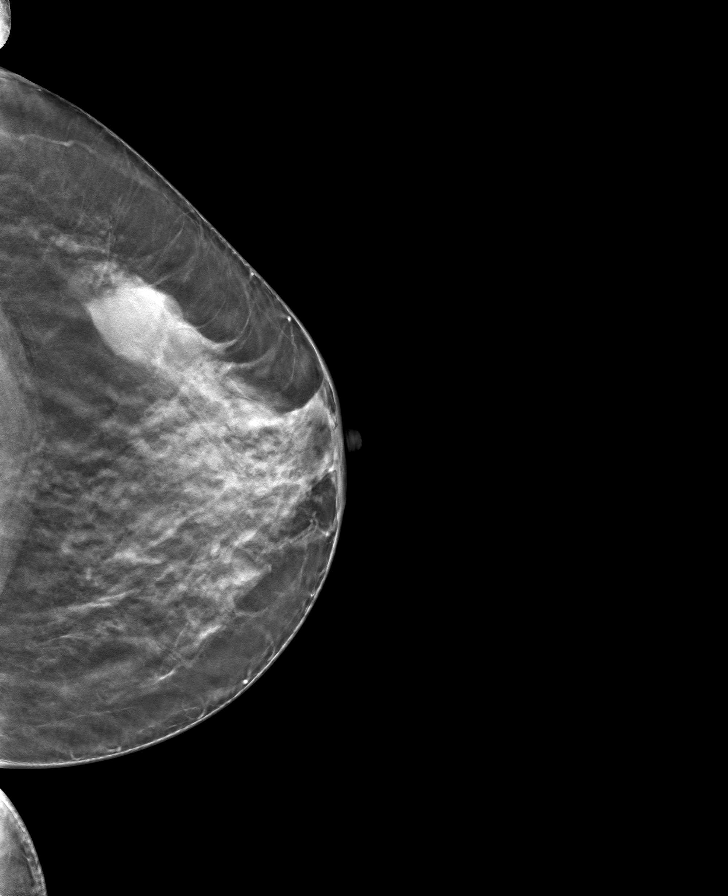

[L MLO tomo · tomo slice 35/70.0]
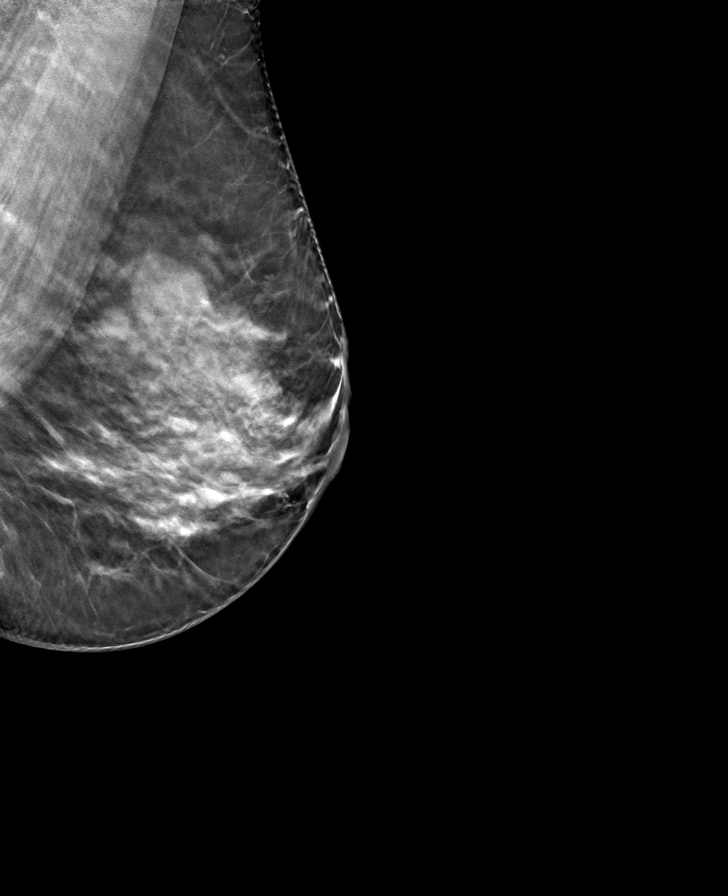

[R MLO tomo · tomo slice 33/64.0]
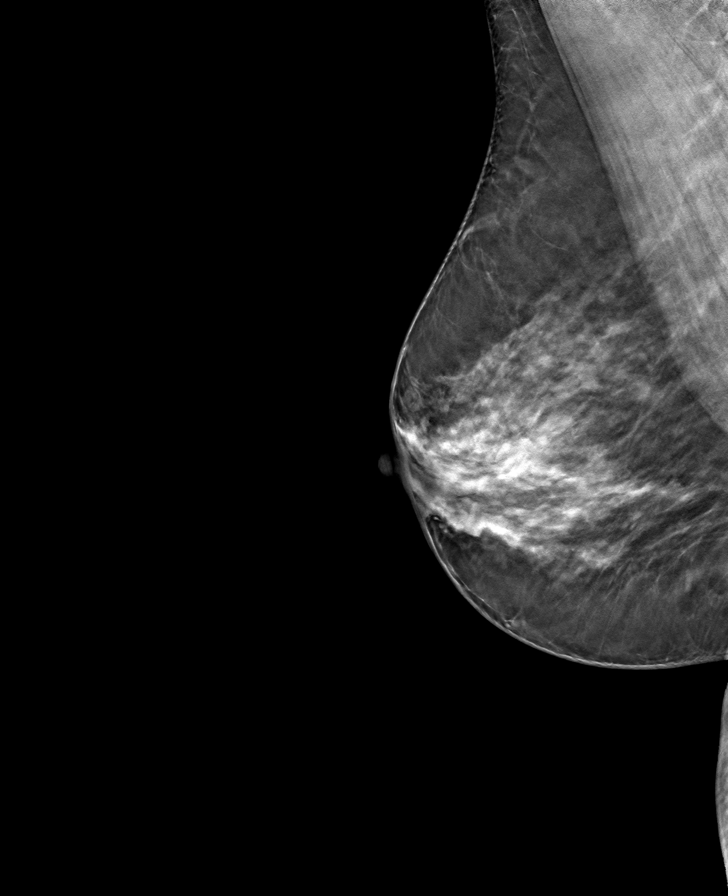

[R CC tomo · tomo slice 31/61.0]
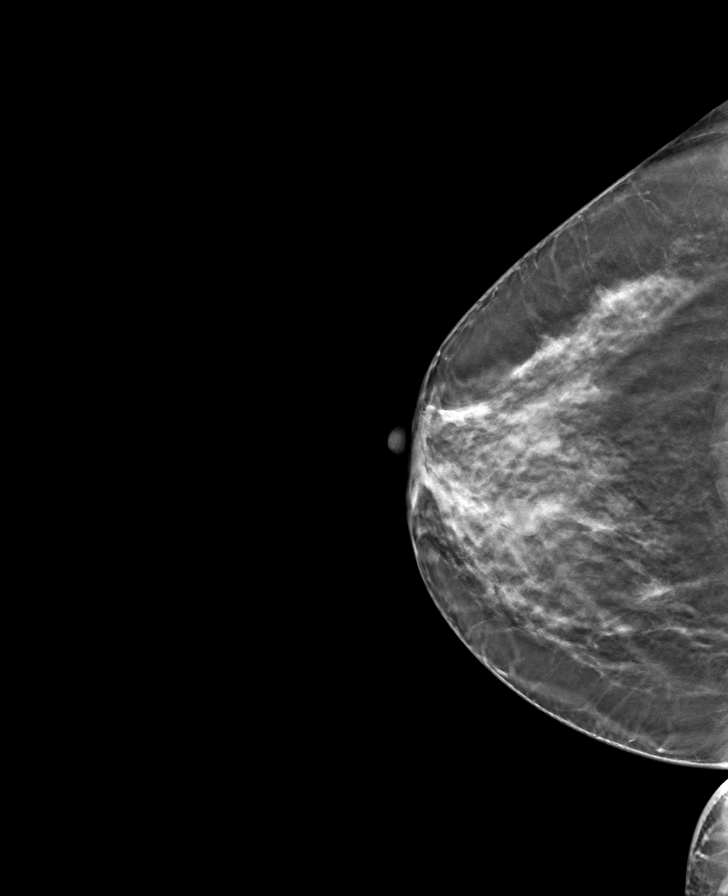

[8 of 24 positions shown; findings below may reference images not displayed]

ACR Breast Density Category c: The breast tissue is heterogeneously
dense, which may obscure small masses.
FINDINGS: No suspicious calcifications, masses or areas of distortion are seen
in the bilateral breasts.

Mammographic images were processed with CAD.
IMPRESSION: 1. There are no suspicious mammographic findings in the right breast
to explain the patient's intermittent shooting pains.

2.  No mammographic evidence of malignancy in the bilateral breasts.

RECOMMENDATION:
1. Clinical follow-up recommended for the right breast pain. Any
further workup should be based on clinical grounds.

2.  screening mammogram in one year.(Code:M1-[id number])

I have discussed the findings and recommendations with the patient.
If applicable, a reminder letter will be sent to the patient
regarding the next appointment.

BI-RADS CATEGORY  1: Negative.

## 2021-03-29 DIAGNOSIS — R87610 Atypical squamous cells of undetermined significance on cytologic smear of cervix (ASC-US): Secondary | ICD-10-CM | POA: Insufficient documentation

## 2021-03-30 ENCOUNTER — Other Ambulatory Visit: Payer: Self-pay | Admitting: Obstetrics and Gynecology

## 2021-03-30 DIAGNOSIS — N6310 Unspecified lump in the right breast, unspecified quadrant: Secondary | ICD-10-CM

## 2021-03-31 ENCOUNTER — Other Ambulatory Visit: Payer: 59

## 2021-04-05 ENCOUNTER — Ambulatory Visit
Admission: RE | Admit: 2021-04-05 | Discharge: 2021-04-05 | Disposition: A | Discharge status: Home / Self Care | Payer: BC Managed Care – PPO | Source: Ambulatory Visit | Attending: Obstetrics and Gynecology | Admitting: Obstetrics and Gynecology

## 2021-04-05 ENCOUNTER — Ambulatory Visit
Admission: RE | Admit: 2021-04-05 | Discharge: 2021-04-05 | Disposition: A | Discharge status: Home / Self Care | Payer: 59 | Source: Ambulatory Visit | Attending: Obstetrics and Gynecology | Admitting: Obstetrics and Gynecology

## 2021-04-05 ENCOUNTER — Other Ambulatory Visit: Payer: Self-pay

## 2021-04-05 ENCOUNTER — Other Ambulatory Visit: Payer: Self-pay | Admitting: Obstetrics and Gynecology

## 2021-04-05 DIAGNOSIS — N6310 Unspecified lump in the right breast, unspecified quadrant: Secondary | ICD-10-CM

## 2022-05-03 ENCOUNTER — Ambulatory Visit (INDEPENDENT_AMBULATORY_CARE_PROVIDER_SITE_OTHER): Payer: BC Managed Care – PPO | Admitting: Family Medicine

## 2022-05-03 VITALS — BP 142/93 | HR 101 | Temp 98.7°F | Ht 70.0 in | Wt 206.0 lb

## 2022-05-03 DIAGNOSIS — B9689 Other specified bacterial agents as the cause of diseases classified elsewhere: Secondary | ICD-10-CM

## 2022-05-03 DIAGNOSIS — R059 Cough, unspecified: Secondary | ICD-10-CM | POA: Diagnosis not present

## 2022-05-03 DIAGNOSIS — J019 Acute sinusitis, unspecified: Secondary | ICD-10-CM | POA: Diagnosis not present

## 2022-05-03 DIAGNOSIS — R062 Wheezing: Secondary | ICD-10-CM | POA: Insufficient documentation

## 2022-05-03 LAB — POC COVID19 BINAXNOW: SARS Coronavirus 2 Ag: NEGATIVE

## 2022-05-03 LAB — POCT INFLUENZA A/B
Influenza A, POC: NEGATIVE
Influenza B, POC: NEGATIVE

## 2022-05-03 MED ORDER — DOXYCYCLINE HYCLATE 100 MG PO TABS: 100.0000 mg | ORAL_TABLET | Freq: Two times a day (BID) | ORAL | 0 refills | Status: AC

## 2022-05-03 MED ORDER — ALBUTEROL SULFATE HFA 108 (90 BASE) MCG/ACT IN AERS
2.0000 | INHALATION_SPRAY | Freq: Four times a day (QID) | RESPIRATORY_TRACT | 0 refills | Status: AC | PRN
Start: 2022-05-03 — End: ?

## 2022-05-03 MED ORDER — METHYLPREDNISOLONE 4 MG PO TBPK
ORAL_TABLET | ORAL | 0 refills | Status: AC
Start: 2022-05-03 — End: ?

## 2022-05-03 NOTE — Assessment & Plan Note (Signed)
-   pt wheezing on lung exam and sinus tenderness to palpation of maxillary and frontal sinus bilaterally  - will treat with doxycycline

## 2022-05-03 NOTE — Progress Notes (Signed)
New Patient Office Visit  Subjective    Patient ID: Ashley Gallagher, female    DOB: 09-Jun-1970  Age: 51 y.o. MRN: [id number]  CC:  Chief Complaint  Patient presents with   Cough    HPI Ashley Gallagher presents to establish care. She is having symptoms of cough, congestion, allergies, and wheezing. She is noting a cough for three weeks and says they are exacerbated by her cats. She has vertigo when laying on her right side.   She has tried claritin, dayquil and nyquil that has not helped.   Outpatient Encounter Medications as of 05/03/2022  Medication Sig   albuterol (VENTOLIN HFA) 108 (90 Base) MCG/ACT inhaler Inhale 2 puffs into the lungs every 6 (six) hours as needed for wheezing or shortness of breath.   cholecalciferol (VITAMIN D) 1000 units tablet Take 1,000 Units by mouth daily.   doxycycline (VIBRA-TABS) 100 MG tablet Take 1 tablet (100 mg total) by mouth 2 (two) times daily for 7 days.   ibuprofen (ADVIL,MOTRIN) 400 MG tablet Take 400 mg by mouth every 6 (six) hours as needed.   methylPREDNISolone (MEDROL DOSEPAK) 4 MG TBPK tablet Follow instructions on pill pack   Multiple Vitamins-Minerals (MULTIVITAMIN WITH MINERALS) tablet Take 1 tablet by mouth daily.   ondansetron (ZOFRAN) 8 MG tablet Take 1 tablet (8 mg total) by mouth every 8 (eight) hours as needed for nausea or vomiting. Take 1 tab ('8mg'$ ) every 8 hours as needed for nausea.   ondansetron (ZOFRAN-ODT) 8 MG disintegrating tablet Take 1 tablet (8 mg total) by mouth every 8 (eight) hours as needed for nausea.   vitamin B-12 (CYANOCOBALAMIN) 100 MCG tablet Take 100 mcg by mouth daily.   [DISCONTINUED] ciprofloxacin (CIPRO) 500 MG tablet Take 1 tablet (500 mg total) by mouth 2 (two) times daily. May repeat in 3 days if symptoms persist.   [DISCONTINUED] metroNIDAZOLE (FLAGYL) 500 MG tablet Take 1 tablet (500 mg total) by mouth 3 (three) times daily.   Facility-Administered Encounter Medications as of 05/03/2022   Medication   lactated ringers bolus 1,000 mL    Past Medical History:  Diagnosis Date   Left breast mass    Thyroid disease     Past Surgical History:  Procedure Laterality Date   BREAST BIOPSY     BREAST EXCISIONAL BIOPSY Left 01/2018   BREAST LUMPECTOMY Left 03/09/2018   excisional   CESAREAN SECTION     CESAREAN SECTION WITH BILATERAL TUBAL LIGATION     THYROIDECTOMY, PARTIAL     TUBAL LIGATION     uterine ablation     WISDOM TOOTH EXTRACTION      Family History  Problem Relation Age of Onset   Diabetes Father    Heart failure Father     Social History   Socioeconomic History   Marital status: Single    Spouse name: Not on file   Number of children: Not on file   Years of education: Not on file   Highest education level: Not on file  Occupational History   Not on file  Tobacco Use   Smoking status: Former    Types: Cigarettes    Quit date: 06/07/2015    Years since quitting: 6.9   Smokeless tobacco: Never  Vaping Use   Vaping Use: Never used  Substance and Sexual Activity   Alcohol use: Yes    Comment: 1-2 week   Drug use: No   Sexual activity: Yes    Partners: Male  Birth control/protection: Surgical    Comment: uterine ablation, BTL  Other Topics Concern   Not on file  Social History Narrative   Not on file   Social Determinants of Health   Financial Resource Strain: Not on file  Food Insecurity: Not on file  Transportation Needs: Not on file  Physical Activity: Not on file  Stress: Not on file  Social Connections: Not on file  Intimate Partner Violence: Not on file    Review of Systems  Constitutional:  Negative for chills and fever.  HENT:  Positive for congestion.   Respiratory:  Positive for cough, shortness of breath and wheezing.   Cardiovascular:  Negative for chest pain.  Neurological:  Negative for headaches.        Objective    BP (!) 142/93   Pulse (!) 101   Temp 98.7 F (37.1 C)   Ht '5\' 10"'$  (1.778 m)   Wt 206  lb (93.4 kg)   SpO2 99%   BMI 29.56 kg/m   Physical Exam Vitals and nursing note reviewed.  Constitutional:      General: She is not in acute distress.    Appearance: Normal appearance.  HENT:     Head: Normocephalic and atraumatic.     Right Ear: External ear normal.     Left Ear: External ear normal.     Nose: Nose normal.  Eyes:     Conjunctiva/sclera: Conjunctivae normal.  Cardiovascular:     Rate and Rhythm: Normal rate and regular rhythm.  Pulmonary:     Effort: Pulmonary effort is normal.     Breath sounds: Wheezing present.     Comments: Wheezing and coarse breath sounds in all lung fields Neurological:     General: No focal deficit present.     Mental Status: She is alert and oriented to person, place, and time.     Comments: Miguel Dibble hallpike attempted and pt had symptoms with leaning back to the right  Psychiatric:        Mood and Affect: Mood normal.        Behavior: Behavior normal.        Thought Content: Thought content normal.        Judgment: Judgment normal.      Assessment & Plan:   Problem List Items Addressed This Visit       Respiratory   Acute bacterial sinusitis - Primary    - pt wheezing on lung exam and sinus tenderness to palpation of maxillary and frontal sinus bilaterally  - will treat with doxycycline       Relevant Medications   methylPREDNISolone (MEDROL DOSEPAK) 4 MG TBPK tablet   doxycycline (VIBRA-TABS) 100 MG tablet     Other   Wheezing    - tight wheezing on exam; have given pt prescription for albuterol inhaler and steroids to help with inflammation. Pt did say she was a previous smoker and quit 5 years ago so I am wondering if this is also a copd exacerbation. Pt has had an increase in cats in the house as well and her son has asthma so there could be a genetic component and she could have some underlying asthma that is exacerbated by cat dander. Discussed these possibilities with patient. Believe the inhaler and steroid pack will  help decrease her bronchospasm       Relevant Medications   albuterol (VENTOLIN HFA) 108 (90 Base) MCG/ACT inhaler   methylPREDNISolone (MEDROL DOSEPAK) 4 MG TBPK tablet  Cough in adult    - covid and flu tested and were both interpreted by myself as negative      Relevant Orders   POCT Influenza A/B (Completed)   POC COVID-19 (Completed)    Return if symptoms worsen or fail to improve.   Owens Loffler, DO

## 2022-05-03 NOTE — Assessment & Plan Note (Signed)
-   tight wheezing on exam; have given pt prescription for albuterol inhaler and steroids to help with inflammation. Pt did say she was a previous smoker and quit 5 years ago so I am wondering if this is also a copd exacerbation. Pt has had an increase in cats in the house as well and her son has asthma so there could be a genetic component and she could have some underlying asthma that is exacerbated by cat dander. Discussed these possibilities with patient. Believe the inhaler and steroid pack will help decrease her bronchospasm

## 2022-05-03 NOTE — Assessment & Plan Note (Signed)
-   covid and flu tested and were both interpreted by myself as negative

## 2022-05-19 ENCOUNTER — Other Ambulatory Visit: Payer: Self-pay | Admitting: Obstetrics and Gynecology

## 2022-05-19 DIAGNOSIS — N6310 Unspecified lump in the right breast, unspecified quadrant: Secondary | ICD-10-CM

## 2022-06-07 ENCOUNTER — Ambulatory Visit
Admission: RE | Admit: 2022-06-07 | Discharge: 2022-06-07 | Disposition: A | Discharge status: Home / Self Care | Payer: No Typology Code available for payment source | Source: Ambulatory Visit | Attending: Obstetrics and Gynecology | Admitting: Obstetrics and Gynecology

## 2022-06-07 DIAGNOSIS — N6310 Unspecified lump in the right breast, unspecified quadrant: Secondary | ICD-10-CM

## 2023-09-13 DIAGNOSIS — B009 Herpesviral infection, unspecified: Secondary | ICD-10-CM | POA: Insufficient documentation

## 2023-09-14 ENCOUNTER — Ambulatory Visit: Payer: No Typology Code available for payment source | Admitting: Neurology

## 2023-09-14 ENCOUNTER — Encounter: Payer: Self-pay | Admitting: Neurology

## 2023-09-14 VITALS — BP 138/90 | HR 76 | Ht 68.0 in | Wt 223.6 lb

## 2023-09-14 DIAGNOSIS — Z789 Other specified health status: Secondary | ICD-10-CM

## 2023-09-14 DIAGNOSIS — R419 Unspecified symptoms and signs involving cognitive functions and awareness: Secondary | ICD-10-CM

## 2023-09-14 DIAGNOSIS — F439 Reaction to severe stress, unspecified: Secondary | ICD-10-CM | POA: Diagnosis not present

## 2023-09-14 DIAGNOSIS — R0683 Snoring: Secondary | ICD-10-CM

## 2023-09-14 DIAGNOSIS — Z818 Family history of other mental and behavioral disorders: Secondary | ICD-10-CM | POA: Diagnosis not present

## 2023-09-14 DIAGNOSIS — G4719 Other hypersomnia: Secondary | ICD-10-CM

## 2023-09-14 DIAGNOSIS — F32A Depression, unspecified: Secondary | ICD-10-CM

## 2023-09-14 DIAGNOSIS — R635 Abnormal weight gain: Secondary | ICD-10-CM

## 2023-09-14 DIAGNOSIS — F419 Anxiety disorder, unspecified: Secondary | ICD-10-CM

## 2023-09-14 DIAGNOSIS — R454 Irritability and anger: Secondary | ICD-10-CM | POA: Diagnosis not present

## 2023-09-14 DIAGNOSIS — R0681 Apnea, not elsewhere classified: Secondary | ICD-10-CM

## 2023-09-14 NOTE — Patient Instructions (Signed)
 It was nice to meet you today.  You have complaints of memory loss: memory loss or changes in cognitive function can have many reasons and does not always mean you have dementia.  There are several conditions and situations that can contribute to subjective or objective memory loss.  These factors include: depression, stress, sleep deprivation or poor sleep from insomnia or sleep apnea, dehydration, fluctuation in blood sugar values, thyroid or electrolyte dysfunction, medication effects from sedating medications or narcotic pain medication for example and certain vitamin deficiencies such as vitamin B12 deficiency, and anemia. Dementia can be caused by stroke, brain atherosclerosis or brain vascular disease due to vascular risk factors (smoking, high blood pressure, high cholesterol, obesity and uncontrolled diabetes), certain degenerative brain disorders (including Parkinson's disease and Multiple sclerosis) and by Alzheimer's disease or other, more rare and sometimes hereditary causes.   Here is what I would recommend:   Blood work (which we will do today).  We will do a brain scan, called MRI and call you with the test results. We will have to schedule you for this on a separate date. This test requires authorization from your insurance, and we will take care of the insurance process. We will request a formal cognitive test called neuropsychological evaluation which is done by a licensed neuropsychologist. We will make a referral in that regard.  Please establish with a primary care physician and discuss elevated blood pressure values as well as neck pain and mood irritability as well as symptoms of anxiety and depression. I recommend we proceed with a sleep study to rule out obstructive sleep apnea.  If you have obstructive sleep apnea I will likely recommend treatment with a CPAP or AutoPap machine. We will keep you posted as to your test results by phone call for now.  We will plan a follow-up after  testing.   Please increase your water intake to about 6 to 8 cups of water per day, 8 ounce size each.  Limit your caffeine intake to 1 or 2 servings per day as caffeine can disturb your sleep, feed your anxiety, and cause blood pressure elevation.

## 2023-09-14 NOTE — Progress Notes (Signed)
 Subjective:    Patient ID: Ashley Gallagher is a 53 y.o. female.  HPI    Huston Foley, MD, PhD [facility] [address], [address] [address] [place], [place] [address]  Dear Dr. Henderson Cloud,   I saw your patient, Ashley Gallagher, upon your kind request in my neurologic clinic today for initial consultation of her memory loss.  The patient is unaccompanied today.  As you know, Ashley Gallagher is a 53 year old female with an underlying medical history of left breast mass, status post breast biopsy and lumpectomy, thyroid disease with status post partial thyroidectomy, status post endometrial ablation, and obesity, who reports a several year history of difficulty retaining information and forgetfulness.  Symptoms have been ongoing for about 4 years.  She worries about her family history of dementia, dad has dementia.  He is 51 years old and lives alone.  She endorses a lot of stress and also has anxiety and depression symptoms but has not been treated for these and has not addressed these with any provider.  She has tearfulness during today's visit.  Her mom died in her 54s after her head injury and intracranial hemorrhages understand.  She has 6 siblings alive, none of her brothers and sisters have memory issues except for her oldest brother who is in his 53s and has some memory loss but no official diagnosis of dementia as I understand.  She does not sleep well.  She may fall asleep on the couch, she tries to be in bed between 10 and 11 and rise time is between 5 and 6.  Her Epworth sleepiness score is 13 out of 24, fatigue severity score is 33 out of 63.  She snores and her boyfriend has mentioned breathing pauses to her while she is asleep.  She drinks quite a bit of caffeine in form of coffee, about 3 to 4 cups/day and about 2 to 3 cans of soda per day.  She drinks alcohol infrequently.  She does not hydrate well with water, estimates that she drinks about 1 cup of water per day on  average.  I reviewed your office visit note from 05/24/2023.  She reported trouble with word recall and memory at the time.  She did not have any blood work through [employer], [facility].  No recent blood work is available in her electronic chart.  She has not had any neuroimaging.  She said she had hormones checked through your office and thyroid function which per her report was normal.  Her Past Medical History Is Significant For: Past Medical History:  Diagnosis Date   Left breast mass    Thyroid disease     Her Past Surgical History Is Significant For: Past Surgical History:  Procedure Laterality Date   BREAST BIOPSY     BREAST EXCISIONAL BIOPSY Left 01/2018   BREAST LUMPECTOMY Left 03/09/2018   excisional   CESAREAN SECTION     CESAREAN SECTION WITH BILATERAL TUBAL LIGATION     THYROIDECTOMY, PARTIAL     TUBAL LIGATION     uterine ablation     WISDOM TOOTH EXTRACTION      Her Family History Is Significant For: Family History  Problem Relation Age of Onset   Diabetes Father    Heart failure Father     Her Social History Is Significant For: Social History   Socioeconomic History   Marital status: Single    Spouse name: Not on file   Number of children: Not on file  Years of education: Not on file   Highest education level: Not on file  Occupational History   Not on file  Tobacco Use   Smoking status: Former    Current packs/day: 0.00    Types: Cigarettes    Quit date: 06/07/2015    Years since quitting: 8.2   Smokeless tobacco: Never  Vaping Use   Vaping status: Never Used  Substance and Sexual Activity   Alcohol use: Yes    Comment: 1-2 week   Drug use: No   Sexual activity: Yes    Partners: Male    Birth control/protection: Surgical    Comment: uterine ablation, BTL  Other Topics Concern   Not on file  Social History Narrative   Not on file   Social Drivers of Health   Financial Resource Strain: Not on file  Food Insecurity: Not on file   Transportation Needs: Not on file  Physical Activity: Not on file  Stress: Not on file  Social Connections: Unknown (08/16/2022)   Received from [facility]   Social Network    Social Network: Not on file    Her Allergies Are:  Allergies  Allergen Reactions   Bee Venom    Morphine And Codeine Itching   Sulfamethoxazole-Trimethoprim Rash  :   Her Current Medications Are:  Outpatient Encounter Medications as of 09/14/2023  Medication Sig   albuterol (VENTOLIN HFA) 108 (90 Base) MCG/ACT inhaler Inhale 2 puffs into the lungs every 6 (six) hours as needed for wheezing or shortness of breath.   ibuprofen (ADVIL,MOTRIN) 400 MG tablet Take 400 mg by mouth every 6 (six) hours as needed.   Multiple Vitamins-Minerals (MULTIVITAMIN WITH MINERALS) tablet Take 1 tablet by mouth daily.   triamcinolone ointment (KENALOG) 0.5 % Apply topically.   valACYclovir (VALTREX) 500 MG tablet Take 1 tablet twice a day by oral route for 3 days.   albuterol (VENTOLIN HFA) 108 (90 Base) MCG/ACT inhaler Inhale into the lungs. (Patient not taking: Reported on 09/14/2023)   cholecalciferol (VITAMIN D) 1000 units tablet Take 1,000 Units by mouth daily. (Patient not taking: Reported on 09/14/2023)   EPINEPHrine 0.3 mg/0.3 mL IJ SOAJ injection Inject into the muscle. (Patient not taking: Reported on 09/14/2023)   ibuprofen (ADVIL) 400 MG tablet Take by mouth. (Patient not taking: Reported on 09/14/2023)   methylPREDNISolone (MEDROL DOSEPAK) 4 MG TBPK tablet Follow instructions on pill pack (Patient not taking: Reported on 09/14/2023)   ondansetron (ZOFRAN) 8 MG tablet Take 1 tablet (8 mg total) by mouth every 8 (eight) hours as needed for nausea or vomiting. Take 1 tab (8mg ) every 8 hours as needed for nausea. (Patient not taking: Reported on 09/14/2023)   ondansetron (ZOFRAN-ODT) 8 MG disintegrating tablet Take 1 tablet (8 mg total) by mouth every 8 (eight) hours as needed for nausea. (Patient not taking: Reported on  09/14/2023)   vitamin B-12 (CYANOCOBALAMIN) 100 MCG tablet Take 100 mcg by mouth daily. (Patient not taking: Reported on 09/14/2023)   Facility-Administered Encounter Medications as of 09/14/2023  Medication   lactated ringers bolus 1,000 mL  :   Review of Systems:  Out of a complete 14 point review of systems, all are reviewed and negative with the exception of these symptoms as listed below:  Review of Systems  Neurological:        Room 5 Pt is here Alone. Pt states that her memory issues started 4 years ago. Pt has a history of Dementia in her family (Father). Pt states  that she hit Menopause and thinks maybe the reason why she is having memory issues. Pt states that she will forget conversations. Pt states that she won't remember something until someone brings up the conversation and then she will recall it. Pt states that she is having numbness in her left arm that started a couple of weeks ago out of nowhere.     Objective:  Neurological Exam  Physical Exam Physical Examination:   Vitals:   09/14/23 0830  BP: (!) 138/90  Pulse: 76    General Examination: The patient is a very pleasant 53 y.o. female in no acute distress. She appears well-developed and well-nourished and well groomed.  Intermittently tearful during today's visit.  HEENT: Normocephalic, atraumatic, pupils are equal, round and reactive to light, extraocular tracking is good without limitation to gaze excursion or nystagmus noted. Hearing is grossly intact to tuning fork. Face is symmetric with normal facial animation and normal facial sensation to light touch, temperature and vibration sense. Speech is clear with no dysarthria noted. There is no hypophonia. There is no lip, neck/head, jaw or voice tremor. Neck is supple with full range of passive and active motion. There are no carotid bruits on auscultation. Oropharynx exam reveals: mild mouth dryness, adequate dental hygiene and moderate airway crowding. Mallampati  is class 2, tonsils on the smaller side, neck circumference 14-3/8 inches.  Tongue protrudes centrally and palate elevates symmetrically.  Chest: Clear to auscultation without wheezing, rhonchi or crackles noted.  Heart: S1+S2+0, regular and normal without murmurs, rubs or gallops noted.   Abdomen: Soft, non-tender and non-distended.  Extremities: There is no pitting edema in the distal lower extremities bilaterally.   Skin: Warm and dry without trophic changes noted.   Musculoskeletal: exam reveals no obvious joint deformities.   Neurologically:  Mental status: The patient is awake, alert and oriented in all 4 spheres. Her immediate and remote memory, attention, language skills and fund of knowledge are appropriate. There is no evidence of aphasia, agnosia, apraxia or anomia. Speech is clear with normal prosody and enunciation. Thought process is linear. Mood is constricted and affect is blunted.  Cranial nerves II - XII are as described above under HEENT exam.  Motor exam: Normal bulk, strength and tone is noted. There is no obvious action or resting tremor.  No drift or rebound. Fine motor skills and coordination: Intact finger taps, hand movements and rapid alternating patting with both upper extremities, normal foot taps bilaterally in the lower extremities.  Cerebellar testing: No dysmetria or intention tremor. There is no truncal or gait ataxia.  Normal finger-to-nose, normal heel-to-shin bilaterally.  Romberg negative.  Reflexes 2+ throughout, toes are downgoing bilaterally. Sensory exam: intact to light touch, temperature and vibration sense in the upper and lower extremities.  Gait, station and balance: She stands easily. No veering to one side is noted. No leaning to one side is noted. Posture is age-appropriate and stance is narrow based. Gait shows normal stride length and normal pace. No problems turning are noted.  Normal tandem walk.  Assessment and Plan:  In summary,  Ashley Gallagher is a very pleasant 53 y.o.-year old female with an underlying medical history of left breast mass, status post breast biopsy and lumpectomy, thyroid disease with status post partial thyroidectomy, status post endometrial ablation, and obesity, who presents for evaluation of her cognitive concerns of approximately 4 years duration.  Her memory concerns are most likely multifactorial in nature, contributors include stress, underlying untreated and suboptimally addressed  to mood disorder, sleep disturbance, including possible sleep disordered breathing, suboptimal hydration, excessive caffeine use and blood pressure fluctuation.   Her neurological exam is nonfocal.    Below is a summary of my discussion points and my recommendations to the patient today based on today's visit.  This was an extended visit of over 60 minutes of high complexity, secondary to addressing multiple issues and considerable counseling and coordination of care.  She was given these instructions verbally and also in her MyChart after visit summary electronically.   << Blood work (which we will do today).  We will do a brain scan, called MRI and call you with the test results. We will have to schedule you for this on a separate date. This test requires authorization from your insurance, and we will take care of the insurance process. We will request a formal cognitive test called neuropsychological evaluation which is done by a licensed neuropsychologist. We will make a referral in that regard.  Please establish with a primary care physician and discuss elevated blood pressure values as well as neck pain and mood irritability as well as symptoms of anxiety and depression. I recommend we proceed with a sleep study to rule out obstructive sleep apnea.  If you have obstructive sleep apnea I will likely recommend treatment with a CPAP or AutoPap machine. We will keep you posted as to your test results by phone call for now.   We will plan a follow-up after testing.   Please increase your water intake to about 6 to 8 cups of water per day, 8 ounce size each.  Limit your caffeine intake to 1 or 2 servings per day as caffeine can disturb your sleep, feed your anxiety, and cause blood pressure elevation. >>   We will plan a follow-up accordingly.   Thank you very much for allowing me to participate in the care of this nice patient. If I can be of any further assistance to you please do not hesitate to call me at [phone].  Sincerely,   Huston Foley, MD, PhD

## 2023-09-19 LAB — CBC WITH DIFFERENTIAL/PLATELET
Basophils Absolute: 0 10*3/uL (ref 0.0–0.2)
Basos: 1 %
EOS (ABSOLUTE): 0.3 10*3/uL (ref 0.0–0.4)
Eos: 5 %
Hematocrit: 42.6 % (ref 34.0–46.6)
Hemoglobin: 14.2 g/dL (ref 11.1–15.9)
Immature Grans (Abs): 0 10*3/uL (ref 0.0–0.1)
Immature Granulocytes: 0 %
Lymphocytes Absolute: 2.4 10*3/uL (ref 0.7–3.1)
Lymphs: 42 %
MCH: 31.3 pg (ref 26.6–33.0)
MCHC: 33.3 g/dL (ref 31.5–35.7)
MCV: 94 fL (ref 79–97)
Monocytes Absolute: 0.4 10*3/uL (ref 0.1–0.9)
Monocytes: 7 %
Neutrophils Absolute: 2.7 10*3/uL (ref 1.4–7.0)
Neutrophils: 45 %
Platelets: 289 10*3/uL (ref 150–450)
RBC: 4.53 x10E6/uL (ref 3.77–5.28)
RDW: 12.4 % (ref 11.7–15.4)
WBC: 5.8 10*3/uL (ref 3.4–10.8)

## 2023-09-19 LAB — VITAMIN B1: Thiamine: 119.8 nmol/L (ref 66.5–200.0)

## 2023-09-19 LAB — COMPREHENSIVE METABOLIC PANEL WITH GFR
ALT: 17 IU/L (ref 0–32)
AST: 18 IU/L (ref 0–40)
Albumin: 4.5 g/dL (ref 3.8–4.9)
Alkaline Phosphatase: 93 IU/L (ref 44–121)
BUN/Creatinine Ratio: 17 (ref 9–23)
BUN: 13 mg/dL (ref 6–24)
Bilirubin Total: 0.3 mg/dL (ref 0.0–1.2)
CO2: 24 mmol/L (ref 20–29)
Calcium: 9.4 mg/dL (ref 8.7–10.2)
Chloride: 104 mmol/L (ref 96–106)
Creatinine, Ser: 0.76 mg/dL (ref 0.57–1.00)
Globulin, Total: 2.3 g/dL (ref 1.5–4.5)
Glucose: 101 mg/dL — ABNORMAL HIGH (ref 70–99)
Potassium: 4.7 mmol/L (ref 3.5–5.2)
Sodium: 143 mmol/L (ref 134–144)
Total Protein: 6.8 g/dL (ref 6.0–8.5)
eGFR: 94 mL/min/{1.73_m2} (ref >59)

## 2023-09-19 LAB — HGB A1C W/O EAG: Hgb A1c MFr Bld: 5.6 % (ref 4.8–5.6)

## 2023-09-19 LAB — B12 AND FOLATE PANEL
Folate: 15.6 ng/mL (ref >3.0)
Vitamin B-12: 220 pg/mL — ABNORMAL LOW (ref 232–1245)

## 2023-09-19 LAB — RPR: RPR Ser Ql: NONREACTIVE

## 2023-09-19 LAB — TSH: TSH: 4 u[IU]/mL (ref 0.450–4.500)

## 2023-09-19 LAB — SEDIMENTATION RATE: Sed Rate: 15 mm/h (ref 0–40)

## 2023-09-19 LAB — VITAMIN D 25 HYDROXY (VIT D DEFICIENCY, FRACTURES): Vit D, 25-Hydroxy: 18.9 ng/mL — ABNORMAL LOW (ref 30.0–100.0)

## 2023-09-19 LAB — VITAMIN B6: Vitamin B6: 7.6 ug/L (ref 3.4–65.2)

## 2023-09-20 ENCOUNTER — Telehealth: Payer: Self-pay | Admitting: *Deleted

## 2023-09-20 ENCOUNTER — Encounter: Payer: Self-pay | Admitting: Psychology

## 2023-09-20 NOTE — Telephone Encounter (Signed)
-----   Message from Debbra Fairy sent at 09/19/2023  4:38 PM EDT ----- Please advise patient that her vitamin D is below normal and that her vitamin B12 is below normal.  I recommend she follow-up with her PCP regarding supplementation with vitamin B12 injections as well as vitamin D prescription.  I recommend in the meantime, that she start an over-the-counter vitamin D supplement, about 1000 units daily and over-the-counter vitamin B12, about 1000 mcg daily.

## 2023-09-20 NOTE — Telephone Encounter (Signed)
 Called Ashley Gallagher and LVM (ok per DPR) with lab results and recommendations from Dr Omar Bibber. I asked the Ashley Gallagher to call us  back. I do not see a PCP on her chart for us  to send the results to.

## 2023-09-21 NOTE — Telephone Encounter (Signed)
 Attempted to reach patient again. LVM asking for call back.

## 2023-09-25 ENCOUNTER — Telehealth: Payer: Self-pay | Admitting: *Deleted

## 2023-09-25 ENCOUNTER — Telehealth: Payer: Self-pay | Admitting: Neurology

## 2023-09-25 NOTE — Telephone Encounter (Signed)
 Pt has called to cx the MRI for 4-23, she said she will call back to r/s

## 2023-09-25 NOTE — Telephone Encounter (Signed)
 I called pt and relayed the results of labs to her.  She did see in mychart.  She is already on Vit D per her obgyn.  She cancelled her MRI due to being seen in ED and had CT done.  I relayed that MRI done by magnets and no radiation involved.  Encouraged to reschedule MRI when she can. She verbalized understanding of results.

## 2023-09-25 NOTE — Telephone Encounter (Signed)
-----   Message from Debbra Fairy sent at 09/19/2023  4:38 PM EDT ----- Please advise patient that her vitamin D is below normal and that her vitamin B12 is below normal.  I recommend she follow-up with her PCP regarding supplementation with vitamin B12 injections as well as vitamin D prescription.  I recommend in the meantime, that she start an over-the-counter vitamin D supplement, about 1000 units daily and over-the-counter vitamin B12, about 1000 mcg daily.

## 2023-09-26 NOTE — Telephone Encounter (Signed)
I cancelled her appointment

## 2023-09-27 ENCOUNTER — Other Ambulatory Visit

## 2023-10-12 ENCOUNTER — Encounter: Payer: Self-pay | Admitting: Family Medicine

## 2023-10-16 ENCOUNTER — Encounter: Attending: Psychology | Admitting: Psychology

## 2023-10-25 ENCOUNTER — Encounter

## 2023-10-31 ENCOUNTER — Encounter: Admitting: Psychology

## 2023-11-03 ENCOUNTER — Ambulatory Visit: Admitting: Psychology
